# Patient Record
Sex: Male | Born: 1980
Health system: Southern US, Community
[De-identification: ages and names within clinical notes are randomized; demographics above are authoritative.]

## PROBLEM LIST (undated history)

## (undated) DIAGNOSIS — Z8669 Personal history of other diseases of the nervous system and sense organs: Secondary | ICD-10-CM

## (undated) HISTORY — DX: Personal history of other diseases of the nervous system and sense organs: Z86.69

---

## 2009-08-17 ENCOUNTER — Emergency Department (HOSPITAL_BASED_OUTPATIENT_CLINIC_OR_DEPARTMENT_OTHER): Admission: EM | Admit: 2009-08-17 | Discharge: 2009-08-17 | Payer: Self-pay | Admitting: Emergency Medicine

## 2010-08-16 ENCOUNTER — Emergency Department (HOSPITAL_COMMUNITY)
Admission: EM | Admit: 2010-08-16 | Discharge: 2010-08-16 | Disposition: A | Discharge status: Home / Self Care | Payer: Self-pay | Attending: Emergency Medicine | Admitting: Emergency Medicine

## 2010-08-16 DIAGNOSIS — R109 Unspecified abdominal pain: Secondary | ICD-10-CM | POA: Insufficient documentation

## 2010-08-16 DIAGNOSIS — K625 Hemorrhage of anus and rectum: Secondary | ICD-10-CM | POA: Insufficient documentation

## 2010-08-16 DIAGNOSIS — R197 Diarrhea, unspecified: Secondary | ICD-10-CM | POA: Insufficient documentation

## 2010-08-16 DIAGNOSIS — R110 Nausea: Secondary | ICD-10-CM | POA: Insufficient documentation

## 2010-08-16 LAB — DIFFERENTIAL
Basophils Absolute: 0 10*3/uL (ref 0.0–0.1)
Basophils Relative: 0 % (ref 0–1)
Eosinophils Absolute: 0 10*3/uL (ref 0.0–0.7)
Eosinophils Relative: 0 % (ref 0–5)
Lymphocytes Relative: 6 % — ABNORMAL LOW (ref 12–46)
Lymphs Abs: 0.7 10*3/uL (ref 0.7–4.0)
Monocytes Absolute: 0.9 10*3/uL (ref 0.1–1.0)
Monocytes Relative: 8 % (ref 3–12)
Neutro Abs: 9.9 10*3/uL — ABNORMAL HIGH (ref 1.7–7.7)
Neutrophils Relative %: 86 % — ABNORMAL HIGH (ref 43–77)

## 2010-08-16 LAB — CBC
HCT: 43.9 % (ref 39.0–52.0)
Hemoglobin: 15 g/dL (ref 13.0–17.0)
MCH: 28.6 pg (ref 26.0–34.0)
MCHC: 34.2 g/dL (ref 30.0–36.0)
MCV: 83.8 fL (ref 78.0–100.0)
Platelets: 228 10*3/uL (ref 150–400)
RBC: 5.24 MIL/uL (ref 4.22–5.81)
RDW: 12.3 % (ref 11.5–15.5)
WBC: 11.6 10*3/uL — ABNORMAL HIGH (ref 4.0–10.5)

## 2010-08-16 LAB — POCT I-STAT, CHEM 8
BUN: 6 mg/dL (ref 6–23)
Calcium, Ion: 1.09 mmol/L — ABNORMAL LOW (ref 1.12–1.32)
Chloride: 99 mEq/L (ref 96–112)
Creatinine, Ser: 1.5 mg/dL (ref 0.4–1.5)
Glucose, Bld: 112 mg/dL — ABNORMAL HIGH (ref 70–99)
HCT: 49 % (ref 39.0–52.0)
Hemoglobin: 16.7 g/dL (ref 13.0–17.0)
Potassium: 3.7 mEq/L (ref 3.5–5.1)
Sodium: 135 mEq/L (ref 135–145)
TCO2: 24 mmol/L (ref 0–100)

## 2010-08-16 LAB — OCCULT BLOOD, POC DEVICE: Fecal Occult Bld: NEGATIVE

## 2012-08-06 ENCOUNTER — Ambulatory Visit: Payer: Self-pay | Admitting: Family Medicine

## 2012-09-02 ENCOUNTER — Ambulatory Visit (INDEPENDENT_AMBULATORY_CARE_PROVIDER_SITE_OTHER): Payer: BC Managed Care – PPO | Admitting: Family Medicine

## 2012-09-02 ENCOUNTER — Encounter: Payer: Self-pay | Admitting: Family Medicine

## 2012-09-02 VITALS — BP 125/80 | HR 64 | Temp 98.7°F | Ht 72.75 in | Wt 251.2 lb

## 2012-09-02 DIAGNOSIS — Z23 Encounter for immunization: Secondary | ICD-10-CM

## 2012-09-02 DIAGNOSIS — Z0000 Encounter for general adult medical examination without abnormal findings: Secondary | ICD-10-CM

## 2012-09-02 NOTE — Progress Notes (Signed)
Subjective:    Patient ID: Andrew Ball, male    DOB: Sep 25, 1980, 32 y.o.   MRN: [id number]  HPI Here to get est for primary care  Does not have a regular doctor -his girlfriend wanted him to get est    Has hx of seizure in past  When young child - petit mal only Has not had one in over 20 years -and never found a cause   Very strong fam hx of HTN Was worried about that  He has checked bp in past and it has been high   Exercise- works with a Psychologist, educational - checked bp - and it was a bit high- and a week better it was better  Changed diet and working out a bit more  He quit fried foods and less beef than he used to  Eating more vegetables  Some protein shakes for meals at times   He does have to take clients to dinner - and tries to eat healthy when he does ([employer])  Obese with bmi of 33 - but very large frame   Not a heavy drinker - occ social drink on the weekends Not a smoker   Gets 8 hours of sleep a night  Monogamous partner  No children   Patient Active Problem List   Diagnosis Date Noted  . Routine general medical examination at a health care facility 09/02/2012   Past Medical History  Diagnosis Date  . History of petit-mal seizures     as a child no seizures in over 20 years   No past surgical history on file. History  Substance Use Topics  . Smoking status: Never Smoker   . Smokeless tobacco: Not on file  . Alcohol Use: Yes     Comment: occ   Family History  Problem Relation Age of Onset  . Alcohol abuse Other     Uncle  . Arthritis Maternal Grandmother   . Arthritis Paternal Grandmother   . Heart disease Paternal Grandfather     MI, open heart surgery   . Heart attack Paternal Grandfather   . High blood pressure Father   . High blood pressure Other     all grandparents, and uncles and other relatives   No Known Allergies No current outpatient prescriptions on file prior to visit.   No current facility-administered medications  on file prior to visit.    Review of Systems Review of Systems  Constitutional: Negative for fever, appetite change, fatigue and unexpected weight change.  Eyes: Negative for pain and visual disturbance.  Respiratory: Negative for cough and shortness of breath.  neg for PND or orthopnea or pedal edema  Cardiovascular: Negative for cp or palpitations    Gastrointestinal: Negative for nausea, diarrhea and constipation.  Genitourinary: Negative for urgency and frequency.  Skin: Negative for pallor or rash   Neurological: Negative for weakness, light-headedness, numbness and headaches.  Hematological: Negative for adenopathy. Does not bruise/bleed easily.  Psychiatric/Behavioral: Negative for dysphoric mood. The patient is not nervous/anxious.         Objective:   Physical Exam  Constitutional: He appears well-developed and well-nourished. No distress.  overwt and well appearing   HENT:  Head: Normocephalic and atraumatic.  Right Ear: External ear normal.  Left Ear: External ear normal.  Nose: Nose normal.  Mouth/Throat: Oropharynx is clear and moist.  Eyes: Conjunctivae and EOM are normal. Pupils are equal, round, and reactive to light. Right eye exhibits no discharge. Left eye exhibits no  discharge. No scleral icterus.  Neck: Normal range of motion. Neck supple. No JVD present. Carotid bruit is not present. No thyromegaly present.  Cardiovascular: Normal rate, regular rhythm, normal heart sounds and intact distal pulses.  Exam reveals no gallop.   Pulmonary/Chest: Effort normal and breath sounds normal. No respiratory distress. He has no wheezes.  Abdominal: Soft. Bowel sounds are normal. He exhibits no distension, no abdominal bruit and no mass. There is no tenderness.  Musculoskeletal: He exhibits no edema and no tenderness.  No acute joint changes   Lymphadenopathy:    He has no cervical adenopathy.  Neurological: He is alert. He has normal reflexes. No cranial nerve deficit. He  exhibits normal muscle tone. Coordination normal.  Skin: Skin is warm and dry. No rash noted. No erythema. No pallor.  Psychiatric: He has a normal mood and affect.  Cheerful and talkative           Assessment & Plan:

## 2012-09-02 NOTE — Assessment & Plan Note (Signed)
Reviewed health habits including diet and exercise and skin cancer prevention Also reviewed health mt list, fam hx and immunizations   Commended on good effort towards wt loss/ diet /exercise lately - disc healthy wt and bp to achieve Strong fam of HTN in family- will watch out for that (BP: 125/80 mmHg  ) Wellness labs today  Handout given on DASH diet and also HTN to read today

## 2012-09-02 NOTE — Patient Instructions (Addendum)
Keep taking good care of yourself  Continue exercise and also good diet  Blood pressure was great on re check today 125/80- so this is reassuring You have a very strong risk for high blood pressure in the future  Lab today Tetanus shot today

## 2012-09-03 LAB — CBC WITH DIFFERENTIAL/PLATELET
Basophils Absolute: 0 10*3/uL (ref 0.0–0.1)
Basophils Relative: 0.4 % (ref 0.0–3.0)
Eosinophils Absolute: 0.1 10*3/uL (ref 0.0–0.7)
Eosinophils Relative: 1.9 % (ref 0.0–5.0)
HCT: 43.6 % (ref 39.0–52.0)
Hemoglobin: 14.2 g/dL (ref 13.0–17.0)
Lymphocytes Relative: 37.8 % (ref 12.0–46.0)
Lymphs Abs: 2.1 10*3/uL (ref 0.7–4.0)
MCHC: 32.6 g/dL (ref 30.0–36.0)
MCV: 88.7 fl (ref 78.0–100.0)
Monocytes Absolute: 0.5 10*3/uL (ref 0.1–1.0)
Monocytes Relative: 8.2 % (ref 3.0–12.0)
Neutro Abs: 2.9 10*3/uL (ref 1.4–7.7)
Neutrophils Relative %: 51.7 % (ref 43.0–77.0)
Platelets: 239 10*3/uL (ref 150.0–400.0)
RBC: 4.92 Mil/uL (ref 4.22–5.81)
RDW: 13.3 % (ref 11.5–14.6)
WBC: 5.5 10*3/uL (ref 4.5–10.5)

## 2012-09-03 LAB — COMPREHENSIVE METABOLIC PANEL
ALT: 19 U/L (ref 0–53)
AST: 18 U/L (ref 0–37)
Albumin: 4.3 g/dL (ref 3.5–5.2)
Alkaline Phosphatase: 58 U/L (ref 39–117)
BUN: 16 mg/dL (ref 6–23)
CO2: 25 mEq/L (ref 19–32)
Calcium: 9.5 mg/dL (ref 8.4–10.5)
Chloride: 106 mEq/L (ref 96–112)
Creatinine, Ser: 1.3 mg/dL (ref 0.4–1.5)
GFR: 80.61 mL/min (ref >60.00)
Glucose, Bld: 84 mg/dL (ref 70–99)
Potassium: 4.2 mEq/L (ref 3.5–5.1)
Sodium: 139 mEq/L (ref 135–145)
Total Bilirubin: 0.4 mg/dL (ref 0.3–1.2)
Total Protein: 7.8 g/dL (ref 6.0–8.3)

## 2012-09-03 LAB — LIPID PANEL
Cholesterol: 199 mg/dL (ref 0–200)
HDL: 46.6 mg/dL (ref >39.00)
LDL Cholesterol: 119 mg/dL — ABNORMAL HIGH (ref 0–99)
Total CHOL/HDL Ratio: 4
Triglycerides: 169 mg/dL — ABNORMAL HIGH (ref 0.0–149.0)
VLDL: 33.8 mg/dL (ref 0.0–40.0)

## 2012-09-03 LAB — TSH: TSH: 0.82 u[IU]/mL (ref 0.35–5.50)

## 2012-09-04 ENCOUNTER — Encounter: Payer: Self-pay | Admitting: *Deleted

## 2012-12-20 ENCOUNTER — Ambulatory Visit (INDEPENDENT_AMBULATORY_CARE_PROVIDER_SITE_OTHER): Payer: BC Managed Care – PPO | Admitting: Family Medicine

## 2012-12-20 ENCOUNTER — Encounter: Payer: Self-pay | Admitting: Family Medicine

## 2012-12-20 VITALS — BP 126/82 | HR 71 | Temp 98.8°F | Ht 72.75 in | Wt 252.0 lb

## 2012-12-20 DIAGNOSIS — Z202 Contact with and (suspected) exposure to infections with a predominantly sexual mode of transmission: Secondary | ICD-10-CM | POA: Insufficient documentation

## 2012-12-20 DIAGNOSIS — Z2089 Contact with and (suspected) exposure to other communicable diseases: Secondary | ICD-10-CM

## 2012-12-20 DIAGNOSIS — Z23 Encounter for immunization: Secondary | ICD-10-CM

## 2012-12-20 MED ORDER — CEFIXIME 400 MG PO CAPS: ORAL_CAPSULE | ORAL | Status: DC

## 2012-12-20 MED ORDER — AZITHROMYCIN 500 MG PO TABS: ORAL_TABLET | ORAL | Status: DC

## 2012-12-20 NOTE — Progress Notes (Signed)
  Subjective:    Patient ID: Andrew Ball, male    DOB: 17-Jan-1981, 32 y.o.   MRN: [id number]  HPI Here after he found out that he was exposed to gonorrhea   Last checked for STDs was in April   He does have a spot to check out  Not painful Is on his penis  Used some tea tree oil   Has had gonorrhea in the past   Very good about using condoms   He has a new partner -getting checked out as well   He has a little discharge No burning to urinate   Patient Active Problem List   Diagnosis Date Noted  . Exposure to STD 12/20/2012  . Routine general medical examination at a health care facility 09/02/2012   Past Medical History  Diagnosis Date  . History of petit-mal seizures     as a child no seizures in over 20 years   No past surgical history on file. History  Substance Use Topics  . Smoking status: Never Smoker   . Smokeless tobacco: Not on file  . Alcohol Use: Yes     Comment: occ   Family History  Problem Relation Age of Onset  . Alcohol abuse Other     Uncle  . Arthritis Maternal Grandmother   . Arthritis Paternal Grandmother   . Heart disease Paternal Grandfather     MI, open heart surgery   . Heart attack Paternal Grandfather   . High blood pressure Father   . High blood pressure Other     all grandparents, and uncles and other relatives   No Known Allergies No current outpatient prescriptions on file prior to visit.   No current facility-administered medications on file prior to visit.    Review of Systems Review of Systems  Constitutional: Negative for fever, appetite change, fatigue and unexpected weight change.  Eyes: Negative for pain and visual disturbance.  Respiratory: Negative for cough and shortness of breath.   Cardiovascular: Negative for cp or palpitations    Gastrointestinal: Negative for nausea, diarrhea and constipation.  Genitourinary: Negative for urgency and frequency. neg for dysuria  Skin: Negative for pallor or rash    Neurological: Negative for weakness, light-headedness, numbness and headaches.  Hematological: Negative for adenopathy. Does not bruise/bleed easily.  Psychiatric/Behavioral: Negative for dysphoric mood. The patient is not nervous/anxious.         Objective:   Physical Exam  Constitutional: He appears well-developed and well-nourished. No distress.  HENT:  Head: Normocephalic and atraumatic.  Eyes: Conjunctivae and EOM are normal. Pupils are equal, round, and reactive to light.  Neck: Normal range of motion. Neck supple.  Cardiovascular: Normal rate and regular rhythm.   Pulmonary/Chest: Effort normal and breath sounds normal. He has no rales.  Abdominal: Soft. Bowel sounds are normal. He exhibits no distension and no mass. There is no tenderness.  Genitourinary:  Small skin tag- at base of penis anteriorly No discharge or lesion or tenderness  Swab for std test obt  Musculoskeletal: He exhibits no edema.  Lymphadenopathy:    He has no cervical adenopathy.  Neurological: He is alert. He has normal reflexes.  Skin: Skin is warm and dry. No rash noted. No pallor.  Psychiatric: He has a normal mood and affect.          Assessment & Plan:

## 2012-12-20 NOTE — Patient Instructions (Addendum)
Flu shot today  Labs today  We will update you with results  If any problems let me know  Take the antibiotics as directed

## 2012-12-21 LAB — RPR TITER: RPR Titer: 1:16 {titer} — AB

## 2012-12-21 LAB — RPR: RPR Ser Ql: REACTIVE — AB

## 2012-12-21 LAB — GC/CHLAMYDIA PROBE AMP
CT Probe RNA: NEGATIVE
GC Probe RNA: NEGATIVE

## 2012-12-21 LAB — HIV ANTIBODY (ROUTINE TESTING W REFLEX): HIV: NONREACTIVE

## 2012-12-22 NOTE — Assessment & Plan Note (Signed)
With known exp to gonorrhea Empiric tx for gc/ chlamy given Tests for std today incl gc/ chl swab as well as HIV and RPR Disc imp of condom use

## 2012-12-23 LAB — T.PALLIDUM AB, TOTAL: T pallidum Antibodies (TP-PA): >8 S/CO — ABNORMAL HIGH (ref <0.90)

## 2013-01-14 ENCOUNTER — Encounter: Payer: Self-pay | Admitting: Family Medicine

## 2013-01-14 ENCOUNTER — Ambulatory Visit (INDEPENDENT_AMBULATORY_CARE_PROVIDER_SITE_OTHER): Payer: BC Managed Care – PPO | Admitting: Family Medicine

## 2013-01-14 VITALS — BP 106/70 | HR 73 | Temp 98.6°F | Ht 72.75 in | Wt 255.0 lb

## 2013-01-14 DIAGNOSIS — J069 Acute upper respiratory infection, unspecified: Secondary | ICD-10-CM

## 2013-01-14 MED ORDER — BENZONATATE 200 MG PO CAPS: 200.0000 mg | ORAL_CAPSULE | Freq: Three times a day (TID) | ORAL | Status: DC | PRN

## 2013-01-14 NOTE — Patient Instructions (Signed)
Drink lots of fluids and try to get extra rest  Try the tessalon pills - swallow one pill whole up to three times per day  Also get mucinex DM over the counter and take as directed to loosen and help cough also  Update if not starting to improve in a week or if worsening

## 2013-01-14 NOTE — Progress Notes (Signed)
  Subjective:    Patient ID: Andrew Ball, male    DOB: 01-05-1981, 32 y.o.   MRN: [id number]  HPI Here with a cough Has had it for 2 weeks Started as a dry hacking cough-now chest sounds rattly- mucous prod in am - yellow  All in chest A bit of nasal congestion at night No ear or throat pain   No fever   Took claritin otc Also cough drops Day quil does not help much   Patient Active Problem List   Diagnosis Date Noted  . Exposure to STD 12/20/2012  . Routine general medical examination at a health care facility 09/02/2012   Past Medical History  Diagnosis Date  . History of petit-mal seizures     as a child no seizures in over 20 years   No past surgical history on file. History  Substance Use Topics  . Smoking status: Never Smoker   . Smokeless tobacco: Not on file  . Alcohol Use: Yes     Comment: occ   Family History  Problem Relation Age of Onset  . Alcohol abuse Other     Uncle  . Arthritis Maternal Grandmother   . Arthritis Paternal Grandmother   . Heart disease Paternal Grandfather     MI, open heart surgery   . Heart attack Paternal Grandfather   . High blood pressure Father   . High blood pressure Other     all grandparents, and uncles and other relatives   No Known Allergies No current outpatient prescriptions on file prior to visit.   No current facility-administered medications on file prior to visit.     Review of Systems Review of Systems  Constitutional: Negative for fever, appetite change, fatigue and unexpected weight change.  Eyes: Negative for pain and visual disturbance.  ENT pos for rhinorrhea without sinus pain or st Respiratory: Negative for wheeze and shortness of breath.   Cardiovascular: Negative for cp or palpitations    Gastrointestinal: Negative for nausea, diarrhea and constipation.  Genitourinary: Negative for urgency and frequency.  Skin: Negative for pallor or rash   Neurological: Negative for weakness,  light-headedness, numbness and headaches.  Hematological: Negative for adenopathy. Does not bruise/bleed easily.  Psychiatric/Behavioral: Negative for dysphoric mood. The patient is not nervous/anxious.         Objective:   Physical Exam  Constitutional: He appears well-developed and well-nourished. No distress.  overwt and well app  HENT:  Head: Normocephalic and atraumatic.  Left Ear: External ear normal.  Mouth/Throat: Oropharynx is clear and moist.  Nares are injected and congested  No sinus tenderness  Eyes: Conjunctivae and EOM are normal. Pupils are equal, round, and reactive to light. No scleral icterus.  Neck: Normal range of motion. Neck supple.  Cardiovascular: Normal rate and regular rhythm.   Pulmonary/Chest: Breath sounds normal. No respiratory distress. He has no wheezes. He has no rales.  Harsh bs Upper airway sounds-mild  Lymphadenopathy:    He has no cervical adenopathy.  Neurological: He is alert.  Skin: Skin is warm and dry. No rash noted.  Psychiatric: He has a normal mood and affect.          Assessment & Plan:

## 2013-01-15 NOTE — Assessment & Plan Note (Signed)
Reassuring exam Persistent cyclic cough Tessalon px / also adv mucinex DM Disc symptomatic care - see instructions on AVS  Update if not starting to improve in a week or if worsening  -esp if fever or inc cough

## 2013-01-21 ENCOUNTER — Encounter: Payer: Self-pay | Admitting: Family Medicine

## 2013-01-23 MED ORDER — AZITHROMYCIN 250 MG PO TABS: ORAL_TABLET | ORAL | Status: DC

## 2013-01-23 NOTE — Addendum Note (Signed)
Addended by: Roxy Manns A on: 01/23/2013 12:27 PM   Modules accepted: Orders

## 2013-01-28 ENCOUNTER — Ambulatory Visit: Payer: BC Managed Care – PPO | Admitting: Family Medicine

## 2013-01-28 DIAGNOSIS — Z0289 Encounter for other administrative examinations: Secondary | ICD-10-CM

## 2013-04-24 ENCOUNTER — Encounter: Payer: Self-pay | Admitting: Family Medicine

## 2013-06-11 ENCOUNTER — Ambulatory Visit: Payer: BC Managed Care – PPO | Admitting: Family Medicine

## 2013-06-18 ENCOUNTER — Encounter: Payer: Self-pay | Admitting: Family Medicine

## 2013-06-18 ENCOUNTER — Ambulatory Visit (INDEPENDENT_AMBULATORY_CARE_PROVIDER_SITE_OTHER): Payer: BC Managed Care – PPO | Admitting: Family Medicine

## 2013-06-18 VITALS — BP 120/80 | HR 81 | Temp 98.5°F | Ht 72.75 in | Wt 263.0 lb

## 2013-06-18 DIAGNOSIS — F988 Other specified behavioral and emotional disorders with onset usually occurring in childhood and adolescence: Secondary | ICD-10-CM | POA: Insufficient documentation

## 2013-06-18 DIAGNOSIS — Z01818 Encounter for other preprocedural examination: Secondary | ICD-10-CM

## 2013-06-18 DIAGNOSIS — Z113 Encounter for screening for infections with a predominantly sexual mode of transmission: Secondary | ICD-10-CM

## 2013-06-18 MED ORDER — AMPHETAMINE-DEXTROAMPHET ER 20 MG PO CP24: 20.0000 mg | ORAL_CAPSULE | Freq: Every day | ORAL | Status: DC

## 2013-06-18 NOTE — Progress Notes (Signed)
Pre visit review using our clinic review tool, if applicable. No additional management support is needed unless otherwise documented below in the visit note. 

## 2013-06-18 NOTE — Progress Notes (Signed)
Subjective:    Patient ID: Andrew Ball, male    DOB: 10-05-1980, 33 y.o.   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 [employer]  Is interested in medication now   In day to day life - he has a hard time concentrating on reading -he has to read the same passage over and over - so not comprehending  Brain tends to wander and easily distracted  This has caused some problems with work-esp when he is in meetings -he zones out - difficult to bring himself out No hyperactivity   His brother ADHD   No problems with stimulant med / no appetite issues or wt loss and no GI pain or headache  Denies problems emotionally right now    Wants STD screening today  Known hx of syphillis in the past -was treated fully  and has antibodies  Has had condoms break in the past   Patient Active Problem List   Diagnosis Date Noted  . Viral URI with cough 01/14/2013  . Exposure to STD 12/20/2012  . Routine general medical examination at a health care facility 09/02/2012   Past Medical History  Diagnosis Date  . History of petit-mal seizures     as a child no seizures in over 20 years   History reviewed. No pertinent past surgical history. History  Substance Use Topics  . Smoking status: Never Smoker   . Smokeless tobacco: Not on file  . Alcohol Use: Yes     Comment: occ   Family History  Problem Relation Age of Onset  . Alcohol abuse Other     Uncle  . Arthritis Maternal Grandmother   . Arthritis Paternal Grandmother   . Heart disease Paternal Grandfather     MI, open heart surgery   . Heart attack Paternal Grandfather   . High blood pressure Father     . High blood pressure Other     all grandparents, and uncles and other relatives   No Known Allergies No current outpatient prescriptions on file prior to visit.   No current facility-administered medications on file prior to visit.     Review of Systems Review of Systems  Constitutional: Negative for fever, appetite change, fatigue and unexpected weight change.  Eyes: Negative for pain and visual disturbance.  Respiratory: Negative for cough and shortness of breath.   Cardiovascular: Negative for cp or palpitations    Gastrointestinal: Negative for nausea, diarrhea and constipation.  Genitourinary: Negative for urgency and frequency. neg for genital lesions or discharge  Skin: Negative for pallor or rash   Neurological: Negative for weakness, light-headedness, numbness and headaches.  Hematological: Negative for adenopathy. Does not bruise/bleed easily.  Psychiatric/Behavioral: Negative for dysphoric mood. The patient is not nervous/anxious.         Objective:   Physical Exam  Constitutional: He appears well-developed and well-nourished. No distress.  HENT:  Head: Normocephalic and atraumatic.  Mouth/Throat: Oropharynx is clear and moist.  Eyes: Conjunctivae and EOM are normal. Pupils are equal, round, and reactive to light. No scleral icterus.  Neck:  Normal range of motion. Neck supple. No JVD present. Carotid bruit is not present. No thyromegaly present.  Cardiovascular: Normal rate, regular rhythm, normal heart sounds and intact distal pulses.  Exam reveals no gallop.   Pulmonary/Chest: Effort normal and breath sounds normal. No respiratory distress. He has no wheezes. He exhibits no tenderness.  Musculoskeletal: He exhibits no edema.  Lymphadenopathy:    He has no cervical adenopathy.  Neurological: He is alert. He has normal reflexes. He displays no tremor. No cranial nerve deficit. He exhibits normal muscle tone. Coordination normal.  Skin: Skin is warm and dry. No rash  noted. No erythema. No pallor.  Psychiatric: He has a normal mood and affect.  Pleasant and talkative -was being interrupted by phone which did affect his concentration          Assessment & Plan:

## 2013-06-18 NOTE — Patient Instructions (Signed)
Labs today Start the adderall xr 20 mg daily and let me know how it goes or if you have side effects  Follow up with me in about 3 months

## 2013-06-19 LAB — HIV ANTIBODY (ROUTINE TESTING W REFLEX): HIV: NONREACTIVE

## 2013-06-19 LAB — GC/CHLAMYDIA PROBE AMP, URINE
Chlamydia, Swab/Urine, PCR: NEGATIVE
GC Probe Amp, Urine: NEGATIVE

## 2013-06-20 NOTE — Assessment & Plan Note (Signed)
hiv and gc/ chlam done today  No symptoms Remote hx of treated syphillis in the past

## 2013-06-20 NOTE — Assessment & Plan Note (Signed)
Long disc re: symptoms and w/u and tx in the past  Is getting ready to go back to school Will try adderall xr 20 mg daily - disc poss side eff in detail -will update if any problems Rev method for calling and picking up px by hand monthly  Disc organization skills and need to silence phone in class and when studying  F/u planned

## 2013-06-20 NOTE — Assessment & Plan Note (Signed)
HIV test and gc/ chlam done today No symptoms Remote hx of treated syphillis

## 2013-09-08 ENCOUNTER — Ambulatory Visit: Payer: BC Managed Care – PPO | Admitting: Internal Medicine

## 2013-09-08 DIAGNOSIS — Z0289 Encounter for other administrative examinations: Secondary | ICD-10-CM

## 2013-09-17 ENCOUNTER — Encounter: Payer: Self-pay | Admitting: Family Medicine

## 2013-09-17 ENCOUNTER — Ambulatory Visit (INDEPENDENT_AMBULATORY_CARE_PROVIDER_SITE_OTHER): Payer: BC Managed Care – PPO | Admitting: Family Medicine

## 2013-09-17 VITALS — BP 126/78 | HR 68 | Temp 98.5°F | Ht 72.75 in | Wt 254.8 lb

## 2013-09-17 DIAGNOSIS — F988 Other specified behavioral and emotional disorders with onset usually occurring in childhood and adolescence: Secondary | ICD-10-CM

## 2013-09-17 DIAGNOSIS — Z113 Encounter for screening for infections with a predominantly sexual mode of transmission: Secondary | ICD-10-CM

## 2013-09-17 NOTE — Progress Notes (Signed)
Pre visit review using our clinic review tool, if applicable. No additional management support is needed unless otherwise documented below in the visit note. 

## 2013-09-17 NOTE — Progress Notes (Signed)
Subjective:    Patient ID: Andrew Ball, male    DOB: 04-May-1980, 33 y.o.   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 ADD (attention deficit disorder) 06/18/2013  . Screen for STD (sexually transmitted disease) 06/18/2013  . Exposure to STD 12/20/2012  . Routine general medical examination at a health care facility 09/02/2012   Past Medical History  Diagnosis Date  . History of petit-mal seizures     as a child no seizures in over 20 years   No past surgical history on file. History  Substance Use Topics  . Smoking status: Never Smoker   . Smokeless tobacco: Not on file  . Alcohol Use: Yes     Comment: occ   Family History  Problem Relation Age of Onset  . Alcohol abuse Other     Uncle  . Arthritis Maternal Grandmother   . Arthritis Paternal Grandmother   . Heart disease Paternal Grandfather     MI, open heart surgery   . Heart attack Paternal Grandfather   . High blood pressure Father   . High blood pressure Other     all grandparents, and uncles and other relatives   No Known Allergies Current Outpatient Prescriptions on File Prior to Visit  Medication Sig Dispense Refill  . amphetamine-dextroamphetamine (ADDERALL XR) 20 MG 24 hr capsule Take 1 capsule (20 mg  total) by mouth daily.  30 capsule  0   No current facility-administered medications on file prior to visit.    Review of Systems Review of Systems  Constitutional: Negative for fever, appetite change, fatigue and unexpected weight change.  Eyes: Negative for pain and visual disturbance.  Respiratory: Negative for cough and shortness of breath.   Cardiovascular: Negative for cp or palpitations    Gastrointestinal: Negative for nausea, diarrhea and constipation.  Genitourinary: Negative for urgency and frequency. neg for d/c or dysuria or GU lesions  Skin: Negative for pallor or rash   Neurological: Negative for weakness, light-headedness, numbness and headaches.  Hematological: Negative for adenopathy. Does not bruise/bleed easily.  Psychiatric/Behavioral: Negative for dysphoric mood. The patient is not nervous/anxious.  improved attentiveness        Objective:   Physical Exam  Constitutional: He appears well-developed and well-nourished. No distress.  overwt and well app  HENT:  Head: Normocephalic and atraumatic.  Eyes: Conjunctivae and EOM are normal. Pupils are equal, round, and reactive to light.  Neck: Normal range of motion. Neck supple. No thyromegaly present.  Cardiovascular: Normal rate and regular rhythm.   Pulmonary/Chest: Effort normal and breath sounds normal.  Musculoskeletal: He exhibits no edema and  no tenderness.  Neurological: He is alert. He has normal reflexes. He displays no tremor. No cranial nerve deficit. He exhibits normal muscle tone. Coordination normal.  Skin: Skin is warm and dry. No rash noted. No erythema.  Psychiatric: He has a normal mood and affect.          Assessment & Plan:   Problem List Items Addressed This Visit     Other   ADD (attention deficit disorder) - Primary     Doing well so far with adderall xr when needed Will use more in the fall when back taking classes - will f/u then  No side eff so far Wt loss is intentional        Screen for STD (sexually transmitted disease)     HIV and gc/chlam screen today Disc use of condoms     Relevant Orders      HIV antibody (with reflex) (Completed)      GC/chlamydia probe amp, urine (Completed)

## 2013-09-17 NOTE — Patient Instructions (Signed)
I'm glad the adderall is helping  Also the exercise - keep doing that and also eating a healthy diet  Follow up in the fall  Call when you need a refill

## 2013-09-18 LAB — GC/CHLAMYDIA PROBE AMP, URINE
Chlamydia, Swab/Urine, PCR: NEGATIVE
GC Probe Amp, Urine: NEGATIVE

## 2013-09-18 LAB — HIV ANTIBODY (ROUTINE TESTING W REFLEX): HIV 1&2 Ab, 4th Generation: NONREACTIVE

## 2013-09-18 NOTE — Assessment & Plan Note (Signed)
HIV and gc/chlam screen today Disc use of condoms

## 2013-09-18 NOTE — Assessment & Plan Note (Signed)
Doing well so far with adderall xr when needed Will use more in the fall when back taking classes - will f/u then  No side eff so far Wt loss is intentional

## 2015-04-02 ENCOUNTER — Ambulatory Visit (INDEPENDENT_AMBULATORY_CARE_PROVIDER_SITE_OTHER): Payer: BLUE CROSS/BLUE SHIELD | Admitting: Family Medicine

## 2015-04-02 ENCOUNTER — Encounter: Payer: Self-pay | Admitting: Family Medicine

## 2015-04-02 VITALS — BP 126/62 | HR 78 | Temp 98.4°F | Wt 246.2 lb

## 2015-04-02 DIAGNOSIS — Z23 Encounter for immunization: Secondary | ICD-10-CM

## 2015-04-02 DIAGNOSIS — T50905A Adverse effect of unspecified drugs, medicaments and biological substances, initial encounter: Secondary | ICD-10-CM | POA: Insufficient documentation

## 2015-04-02 DIAGNOSIS — F988 Other specified behavioral and emotional disorders with onset usually occurring in childhood and adolescence: Secondary | ICD-10-CM

## 2015-04-02 DIAGNOSIS — Z113 Encounter for screening for infections with a predominantly sexual mode of transmission: Secondary | ICD-10-CM | POA: Diagnosis not present

## 2015-04-02 DIAGNOSIS — F909 Attention-deficit hyperactivity disorder, unspecified type: Secondary | ICD-10-CM | POA: Diagnosis not present

## 2015-04-02 DIAGNOSIS — T887XXA Unspecified adverse effect of drug or medicament, initial encounter: Secondary | ICD-10-CM

## 2015-04-02 LAB — BASIC METABOLIC PANEL
BUN: 12 mg/dL (ref 6–23)
CO2: 32 mEq/L (ref 19–32)
Calcium: 10 mg/dL (ref 8.4–10.5)
Chloride: 102 mEq/L (ref 96–112)
Creatinine, Ser: 1.28 mg/dL (ref 0.40–1.50)
GFR: 82.9 mL/min (ref >60.00)
Glucose, Bld: 93 mg/dL (ref 70–99)
Potassium: 4.5 mEq/L (ref 3.5–5.1)
Sodium: 139 mEq/L (ref 135–145)

## 2015-04-02 MED ORDER — AMPHETAMINE-DEXTROAMPHET ER 20 MG PO CP24: 20.0000 mg | ORAL_CAPSULE | Freq: Every day | ORAL | Status: DC

## 2015-04-02 MED ORDER — EMTRICITABINE-TENOFOVIR DF 200-300 MG PO TABS: 1.0000 | ORAL_TABLET | Freq: Every day | ORAL | Status: DC

## 2015-04-02 NOTE — Patient Instructions (Signed)
Labs for screening today  Flu shot today  When labs return if kidney function is ok - you can start the prEP treatment    Use condoms

## 2015-04-02 NOTE — Progress Notes (Signed)
Subjective:    Patient ID: Andrew Ball, male    DOB: [birth year], 35 y.o.   MRN: [id number]  HPI Here interested in HIV screening  Gc/ chamydia  Hx of syphilis in the past   Has not had known exp  Has had unprotected sex    Also is interested in the prEP - medication dosing to prevent HIV (Truvada) No hx of kidney problems   Had a cold recently but feeling better now   Has taken adderall in the past for ADD Uses it for big projects - just bought a francise - thinks he will need it for a while - intense work lately  Working long hours   Had his flu shot today   Patient Active Problem List   Diagnosis Date Noted  . ADD (attention deficit disorder) 06/18/2013  . Screen for STD (sexually transmitted disease) 06/18/2013  . Exposure to STD 12/20/2012  . Routine general medical examination at a health care facility 09/02/2012   Past Medical History  Diagnosis Date  . History of petit-mal seizures     as a child no seizures in over 20 years   No past surgical history on file. Social History  Substance Use Topics  . Smoking status: Never Smoker   . Smokeless tobacco: None  . Alcohol Use: Yes     Comment: occ   Family History  Problem Relation Age of Onset  . Alcohol abuse Other     Uncle  . Arthritis Maternal Grandmother   . Arthritis Paternal Grandmother   . Heart disease Paternal Grandfather     MI, open heart surgery   . Heart attack Paternal Grandfather   . High blood pressure Father   . High blood pressure Other     all grandparents, and uncles and other relatives   No Known Allergies Current Outpatient Prescriptions on File Prior to Visit  Medication Sig Dispense Refill  . amphetamine-dextroamphetamine (ADDERALL XR) 20 MG 24 hr capsule Take 1 capsule (20 mg total) by mouth daily. (Patient not taking: Reported on 04/02/2015) 30 capsule 0   No current facility-administered medications on file prior to visit.     Review of Systems Review of Systems    Constitutional: Negative for fever, appetite change, fatigue and unexpected weight change.  Eyes: Negative for pain and visual disturbance.  Respiratory: Negative for cough and shortness of breath.   Cardiovascular: Negative for cp or palpitations    Gastrointestinal: Negative for nausea, diarrhea and constipation.  Genitourinary: Negative for urgency and frequency. neg for penile d/c/lesions or dysuria  Skin: Negative for pallor or rash   Neurological: Negative for weakness, light-headedness, numbness and headaches.  Hematological: Negative for adenopathy. Does not bruise/bleed easily.  Psychiatric/Behavioral: Negative for dysphoric mood. The patient is not nervous/anxious.  pos for trouble concentrating        Objective:   Physical Exam  Constitutional: He appears well-developed and well-nourished. No distress.  overwt and well appearing   HENT:  Head: Normocephalic and atraumatic.  Mouth/Throat: Oropharynx is clear and moist.  Eyes: Conjunctivae and EOM are normal. Pupils are equal, round, and reactive to light.  Neck: Normal range of motion. Neck supple. No JVD present. Carotid bruit is not present. No thyromegaly present.  Cardiovascular: Normal rate, regular rhythm, normal heart sounds and intact distal pulses.  Exam reveals no gallop.   Pulmonary/Chest: Effort normal and breath sounds normal. No respiratory distress. He has no wheezes. He has no rales.  No crackles  Abdominal: Soft. Bowel sounds are normal. He exhibits no distension, no abdominal bruit and no mass. There is no tenderness.  Musculoskeletal: He exhibits no edema.  Lymphadenopathy:    He has no cervical adenopathy.  Neurological: He is alert. He has normal reflexes.  Skin: Skin is warm and dry. No rash noted.  Psychiatric: He has a normal mood and affect.  Pleasant/ talkative and attentive           Assessment & Plan:   Problem List Items Addressed This Visit      Other   ADD (attention deficit  disorder)    Pt takes adderall during times when longer work hours are needed Refilled today - working on a big project       Medication adverse effect    Check renal panel to start Truvada (for HIV prevention) today      Relevant Orders   Basic metabolic panel (Completed)   Screen for STD (sexually transmitted disease)    Screen for HIV and also Gc/Chlamydia Has had syphillis and been treated in the past  No symptoms  Interested in STD prev - adv condom use Also considers himself high risk- disc prEP medicine regimen (Truvada)  Will check renal panel and begin it if normal and if HIV is negative        Relevant Orders   GC/chlamydia probe amp, urine   HIV antibody (with reflex) (Completed)    Other Visit Diagnoses    Need for influenza vaccination    -  Primary    Relevant Orders    Flu Vaccine QUAD 36+ mos PF IM (Fluarix & Fluzone Quad PF) (Completed)

## 2015-04-02 NOTE — Progress Notes (Signed)
Pre visit review using our clinic review tool, if applicable. No additional management support is needed unless otherwise documented below in the visit note. 

## 2015-04-03 LAB — HIV ANTIBODY (ROUTINE TESTING W REFLEX): HIV 1&2 Ab, 4th Generation: NONREACTIVE

## 2015-04-03 NOTE — Assessment & Plan Note (Signed)
Screen for HIV and also Gc/Chlamydia Has had syphillis and been treated in the past  No symptoms  Interested in STD prev - adv condom use Also considers himself high risk- disc prEP medicine regimen (Truvada)  Will check renal panel and begin it if normal and if HIV is negative

## 2015-04-03 NOTE — Assessment & Plan Note (Signed)
Check renal panel to start Truvada (for HIV prevention) today

## 2015-04-03 NOTE — Assessment & Plan Note (Signed)
Pt takes adderall during times when longer work hours are needed Refilled today - working on a big project

## 2015-04-06 LAB — GC/CHLAMYDIA PROBE AMP
CT Probe RNA: NOT DETECTED
GC Probe RNA: NOT DETECTED

## 2015-04-22 ENCOUNTER — Encounter: Payer: Self-pay | Admitting: Family Medicine

## 2015-05-04 ENCOUNTER — Encounter: Payer: Self-pay | Admitting: Family Medicine

## 2015-05-04 ENCOUNTER — Telehealth: Payer: Self-pay | Admitting: Family Medicine

## 2015-05-04 MED ORDER — EMTRICITABINE-TENOFOVIR DF 200-300 MG PO TABS: 1.0000 | ORAL_TABLET | Freq: Every day | ORAL | Status: DC

## 2015-05-04 NOTE — Telephone Encounter (Signed)
Printing truvada for mail order  See his last mychart message-he had a phone #  Thanks

## 2015-05-05 MED ORDER — EMTRICITABINE-TENOFOVIR DF 200-300 MG PO TABS: 1.0000 | ORAL_TABLET | Freq: Every day | ORAL | Status: DC

## 2015-05-05 NOTE — Telephone Encounter (Signed)
Called phone # on [employer] and it was [facility], I sent Rx electronically to mail order

## 2015-05-05 NOTE — Telephone Encounter (Signed)
Called phone # on mychart message and it was [facility] pharmacy, I sent Rx electronically to mail order  

## 2015-06-07 ENCOUNTER — Other Ambulatory Visit: Payer: Self-pay | Admitting: Family Medicine

## 2015-06-07 MED ORDER — AMPHETAMINE-DEXTROAMPHET ER 20 MG PO CP24: 20.0000 mg | ORAL_CAPSULE | Freq: Every day | ORAL | Status: DC

## 2015-06-07 NOTE — Telephone Encounter (Signed)
Pt notified Rx ready for pickup 

## 2015-06-07 NOTE — Telephone Encounter (Signed)
Px printed for pick up in IN box  

## 2015-06-07 NOTE — Telephone Encounter (Signed)
See Mychart message, last refilled on 04/02/15 #30 with 0 refills, last OV was 04/02/15, please advise

## 2015-06-08 ENCOUNTER — Encounter: Payer: Self-pay | Admitting: Family Medicine

## 2015-06-24 ENCOUNTER — Encounter: Payer: Self-pay | Admitting: Family Medicine

## 2015-09-24 ENCOUNTER — Other Ambulatory Visit: Payer: Self-pay | Admitting: Family Medicine

## 2015-09-24 MED ORDER — AMPHETAMINE-DEXTROAMPHET ER 20 MG PO CP24: 20.0000 mg | ORAL_CAPSULE | Freq: Every day | ORAL | Status: DC

## 2015-09-24 NOTE — Telephone Encounter (Signed)
Pt notified Rx ready for pickup 

## 2015-09-24 NOTE — Telephone Encounter (Signed)
Px printed for pick up in IN box  

## 2015-09-24 NOTE — Telephone Encounter (Signed)
See mychart message, last filled on 06/07/15 #30 with 0 refills, last OV was 04/02/15, please advise

## 2016-03-05 ENCOUNTER — Telehealth: Payer: Self-pay | Admitting: Family Medicine

## 2016-03-05 DIAGNOSIS — Z0000 Encounter for general adult medical examination without abnormal findings: Secondary | ICD-10-CM

## 2016-03-05 NOTE — Telephone Encounter (Signed)
-----   Message from Baldomero LamyNatasha C Chavers sent at 02/29/2016  2:02 PM EST ----- Regarding: Cpx labs Tues 12/19, need orders. Thanks! :-) Please order  future cpx labs for pt's upcoming lab appt. Thanks Rodney Boozeasha

## 2016-03-07 ENCOUNTER — Other Ambulatory Visit (INDEPENDENT_AMBULATORY_CARE_PROVIDER_SITE_OTHER): Payer: BLUE CROSS/BLUE SHIELD

## 2016-03-07 DIAGNOSIS — Z202 Contact with and (suspected) exposure to infections with a predominantly sexual mode of transmission: Secondary | ICD-10-CM | POA: Diagnosis not present

## 2016-03-07 DIAGNOSIS — Z23 Encounter for immunization: Secondary | ICD-10-CM

## 2016-03-07 DIAGNOSIS — Z0000 Encounter for general adult medical examination without abnormal findings: Secondary | ICD-10-CM

## 2016-03-07 LAB — COMPREHENSIVE METABOLIC PANEL
ALT: 12 U/L (ref 0–53)
AST: 14 U/L (ref 0–37)
Albumin: 4.3 g/dL (ref 3.5–5.2)
Alkaline Phosphatase: 74 U/L (ref 39–117)
BUN: 17 mg/dL (ref 6–23)
CO2: 29 mEq/L (ref 19–32)
Calcium: 9.3 mg/dL (ref 8.4–10.5)
Chloride: 104 mEq/L (ref 96–112)
Creatinine, Ser: 1.28 mg/dL (ref 0.40–1.50)
GFR: 82.45 mL/min (ref >60.00)
Glucose, Bld: 103 mg/dL — ABNORMAL HIGH (ref 70–99)
Potassium: 4.1 mEq/L (ref 3.5–5.1)
Sodium: 139 mEq/L (ref 135–145)
Total Bilirubin: 0.4 mg/dL (ref 0.2–1.2)
Total Protein: 7.5 g/dL (ref 6.0–8.3)

## 2016-03-07 LAB — LIPID PANEL
Cholesterol: 195 mg/dL (ref 0–200)
HDL: 49.8 mg/dL (ref >39.00)
LDL Cholesterol: 128 mg/dL — ABNORMAL HIGH (ref 0–99)
NonHDL: 145.32
Total CHOL/HDL Ratio: 4
Triglycerides: 88 mg/dL (ref 0.0–149.0)
VLDL: 17.6 mg/dL (ref 0.0–40.0)

## 2016-03-07 LAB — CBC WITH DIFFERENTIAL/PLATELET
Basophils Absolute: 0 10*3/uL (ref 0.0–0.1)
Basophils Relative: 0.6 % (ref 0.0–3.0)
Eosinophils Absolute: 0.1 10*3/uL (ref 0.0–0.7)
Eosinophils Relative: 2.1 % (ref 0.0–5.0)
HCT: 45 % (ref 39.0–52.0)
Hemoglobin: 15 g/dL (ref 13.0–17.0)
Lymphocytes Relative: 41.8 % (ref 12.0–46.0)
Lymphs Abs: 2 10*3/uL (ref 0.7–4.0)
MCHC: 33.5 g/dL (ref 30.0–36.0)
MCV: 88.2 fl (ref 78.0–100.0)
Monocytes Absolute: 0.5 10*3/uL (ref 0.1–1.0)
Monocytes Relative: 9.6 % (ref 3.0–12.0)
Neutro Abs: 2.2 10*3/uL (ref 1.4–7.7)
Neutrophils Relative %: 45.9 % (ref 43.0–77.0)
Platelets: 229 10*3/uL (ref 150.0–400.0)
RBC: 5.09 Mil/uL (ref 4.22–5.81)
RDW: 12.9 % (ref 11.5–15.5)
WBC: 4.9 10*3/uL (ref 4.0–10.5)

## 2016-03-07 LAB — TSH: TSH: 0.67 u[IU]/mL (ref 0.35–4.50)

## 2016-03-08 ENCOUNTER — Encounter: Payer: Self-pay | Admitting: Family Medicine

## 2016-03-08 LAB — HIV ANTIBODY (ROUTINE TESTING W REFLEX): HIV 1&2 Ab, 4th Generation: NONREACTIVE

## 2016-03-09 LAB — RPR: RPR Ser Ql: REACTIVE — AB

## 2016-03-09 LAB — FLUORESCENT TREPONEMAL AB(FTA)-IGG-BLD: Fluorescent Treponemal ABS: REACTIVE — AB

## 2016-03-09 LAB — RPR TITER: RPR Titer: 1:64 {titer} — AB

## 2016-03-14 ENCOUNTER — Ambulatory Visit (INDEPENDENT_AMBULATORY_CARE_PROVIDER_SITE_OTHER): Payer: BLUE CROSS/BLUE SHIELD | Admitting: Family Medicine

## 2016-03-14 ENCOUNTER — Encounter: Payer: Self-pay | Admitting: Family Medicine

## 2016-03-14 VITALS — BP 126/68 | HR 65 | Temp 98.7°F | Ht 73.0 in | Wt 246.5 lb

## 2016-03-14 DIAGNOSIS — Z113 Encounter for screening for infections with a predominantly sexual mode of transmission: Secondary | ICD-10-CM | POA: Diagnosis not present

## 2016-03-14 DIAGNOSIS — Z0000 Encounter for general adult medical examination without abnormal findings: Secondary | ICD-10-CM

## 2016-03-14 DIAGNOSIS — F988 Other specified behavioral and emotional disorders with onset usually occurring in childhood and adolescence: Secondary | ICD-10-CM | POA: Diagnosis not present

## 2016-03-14 NOTE — Patient Instructions (Addendum)
Back off of refined carbohydrates/processed foods and added sugars (sweets)  Weight loss is the best thing you can do to prevent diabetes   For cholesterol    Avoid red meat/ fried foods/ egg yolks/ fatty breakfast meats/ butter, cheese and high fat dairy/ and shellfish   (fast food tends to be loaded)   Get back to exercise   Urine test today for gonorrhea and chlamydia

## 2016-03-14 NOTE — Assessment & Plan Note (Signed)
Reviewed health habits including diet and exercise and skin cancer prevention Reviewed appropriate screening tests for age  Also reviewed health mt list, fam hx and immunization status , as well as social and family history   See HPI Labs reviewed Gc/chlamydia urine screening  imms utd  Enc low glycemic diet/wt loss and exercise- plan made

## 2016-03-14 NOTE — Progress Notes (Signed)
Subjective:    Patient ID: Andrew Ball, male    DOB: 04-Aug-1980, 35 y.o.   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[employer]    Wt Readings from Last 3 Encounters:  03/14/16 246 lb 8 oz (111.8 kg)  04/02/15 246 lb 4 oz (111.7 kg)  09/17/13 254 lb 12 oz (115.6 kg)  is taking care of himself  Has not lost any weight   (per pt he got down into the 230s and then had to stop going to the gym due to his job) Plans to work with a trainer the first of the year (for both exercise and meal plan)  Eating healthy overall  (quit pork and red meat)  bmi is 32.5  As a rule - he does not eat enough  Bad about breakfast   Tetanus shot 6/14  Flu shot 12/17  HIV screen 12/17 neg   Reactive RPR as expected with hx of treated syphillis in the past   Family hx of HTN BP Readings from Last 3 Encounters:  03/14/16 126/68  04/02/15 126/62  09/17/13 126/78   STD screen  Neg HIV screen  post RPR (was tx for syphillis in the past)  Takes Truvada for HIV prophylaxis   Hx of ADD adderall xr - does not take it a lot- only when he really needs it for concentration / projects, etc  Works well for him when he needs it    Wants to do urine test for gc/ chlamydia  No symptoms   Family hx  Uncle died of MI in July - at 34 years old  He was a smoker and a heavy drinker    Results for orders placed or performed in visit on 03/07/16  CBC with Differential/Platelet  Result Value Ref Range   WBC 4.9 4.0 - 10.5 K/uL   RBC 5.09 4.22 - 5.81 Mil/uL   Hemoglobin 15.0 13.0 - 17.0 g/dL   HCT 40.945.0 81.139.0 - 91.452.0 %   MCV 88.2 78.0 - 100.0 fl   MCHC 33.5 30.0 - 36.0 g/dL   RDW 78.212.9 95.611.5 - 21.315.5 %   Platelets 229.0 150.0 - 400.0 K/uL   Neutrophils Relative % 45.9 43.0 - 77.0 %   Lymphocytes Relative 41.8 12.0 - 46.0 %   Monocytes Relative 9.6 3.0 - 12.0 %   Eosinophils Relative 2.1 0.0 -  5.0 %   Basophils Relative 0.6 0.0 - 3.0 %   Neutro Abs 2.2 1.4 - 7.7 K/uL   Lymphs Abs 2.0 0.7 - 4.0 K/uL   Monocytes Absolute 0.5 0.1 - 1.0 K/uL   Eosinophils Absolute 0.1 0.0 - 0.7 K/uL   Basophils Absolute 0.0 0.0 - 0.1 K/uL  Comprehensive metabolic panel  Result Value Ref Range   Sodium 139 135 - 145 mEq/L   Potassium 4.1 3.5 - 5.1 mEq/L   Chloride 104 96 - 112 mEq/L   CO2 29 19 - 32 mEq/L   Glucose, Bld 103 (H) 70 - 99 mg/dL   BUN 17 6 - 23 mg/dL   Creatinine, Ser 0.861.28 0.40 - 1.50 mg/dL   Total Bilirubin 0.4 0.2 - 1.2 mg/dL   Alkaline Phosphatase 74 39 - 117 U/L   AST 14 0 - 37 U/L   ALT 12 0 - 53 U/L   Total Protein 7.5 6.0 - 8.3 g/dL   Albumin 4.3  3.5 - 5.2 g/dL   Calcium 9.3 8.4 - 40.9 mg/dL   GFR 81.19 >14.78 mL/min  Lipid panel  Result Value Ref Range   Cholesterol 195 0 - 200 mg/dL   Triglycerides 29.5 0.0 - 149.0 mg/dL   HDL 62.13 >08.65 mg/dL   VLDL 78.4 0.0 - 69.6 mg/dL   LDL Cholesterol 295 (H) 0 - 99 mg/dL   Total CHOL/HDL Ratio 4    NonHDL 145.32   TSH  Result Value Ref Range   TSH 0.67 0.35 - 4.50 uIU/mL  HIV antibody  Result Value Ref Range   HIV 1&2 Ab, 4th Generation NONREACTIVE NONREACTIVE  RPR  Result Value Ref Range   RPR Ser Ql REACTIVE (A) NON REAC  Fluorescent treponemal ab(fta)-IgG-bld  Result Value Ref Range   Fluorescent Treponemal ABS REACTIVE (A)   Rpr titer  Result Value Ref Range   RPR Titer 1:64 (A)      Patient Active Problem List   Diagnosis Date Noted  . Medication adverse effect 04/02/2015  . ADD (attention deficit disorder) 06/18/2013  . Screen for STD (sexually transmitted disease) 06/18/2013  . Exposure to STD 12/20/2012  . Routine general medical examination at a health care facility 09/02/2012   Past Medical History:  Diagnosis Date  . History of petit-mal seizures    as a child no seizures in over 20 years   No past surgical history on file. Social History  Substance Use Topics  . Smoking status: Never  Smoker  . Smokeless tobacco: Never Used  . Alcohol use Yes     Comment: occ   Family History  Problem Relation Age of Onset  . Alcohol abuse Other     Uncle  . Arthritis Maternal Grandmother   . Arthritis Paternal Grandmother   . Heart disease Paternal Grandfather     MI, open heart surgery   . Heart attack Paternal Grandfather   . High blood pressure Father   . High blood pressure Other     all grandparents, and uncles and other relatives   No Known Allergies Current Outpatient Prescriptions on File Prior to Visit  Medication Sig Dispense Refill  . amphetamine-dextroamphetamine (ADDERALL XR) 20 MG 24 hr capsule Take 1 capsule (20 mg total) by mouth daily. 30 capsule 0  . emtricitabine-tenofovir (TRUVADA) 200-300 MG tablet Take 1 tablet by mouth daily. 90 tablet 3   No current facility-administered medications on file prior to visit.     Review of Systems Review of Systems  Constitutional: Negative for fever, appetite change, and unexpected weight change. pos for fatigue at times from schedule  Eyes: Negative for pain and visual disturbance.  Respiratory: Negative for cough and shortness of breath.   Cardiovascular: Negative for cp or palpitations    Gastrointestinal: Negative for nausea, diarrhea and constipation.  Genitourinary: Negative for urgency and frequency.  Skin: Negative for pallor or rash   Neurological: Negative for weakness, light-headedness, numbness and headaches.  Hematological: Negative for adenopathy. Does not bruise/bleed easily.  Psychiatric/Behavioral: Negative for dysphoric mood. The patient is not nervous/anxious.         Objective:   Physical Exam  Constitutional: He appears well-developed and well-nourished. No distress.  overwt and well appearing  HENT:  Head: Normocephalic and atraumatic.  Right Ear: External ear normal.  Left Ear: External ear normal.  Nose: Nose normal.  Mouth/Throat: Oropharynx is clear and moist.  Nares are boggy    Eyes: Conjunctivae and EOM are normal. Pupils are equal,  round, and reactive to light. Right eye exhibits no discharge. Left eye exhibits no discharge. No scleral icterus.  Neck: Normal range of motion. Neck supple. No JVD present. Carotid bruit is not present. No thyromegaly present.  Cardiovascular: Normal rate, regular rhythm, normal heart sounds and intact distal pulses.  Exam reveals no gallop.   Pulmonary/Chest: Effort normal and breath sounds normal. No respiratory distress. He has no wheezes. He exhibits no tenderness.  Abdominal: Soft. Bowel sounds are normal. He exhibits no distension, no abdominal bruit and no mass. There is no tenderness.  Musculoskeletal: He exhibits no edema or tenderness.  Lymphadenopathy:    He has no cervical adenopathy.  Neurological: He is alert. He has normal reflexes. No cranial nerve deficit. He exhibits normal muscle tone. Coordination normal.  Skin: Skin is warm and dry. No rash noted. No erythema. No pallor.  Few brown nevi and skin tags  Psychiatric: He has a normal mood and affect.  Calm and attentive/ pleasant and talkative          Assessment & Plan:   Problem List Items Addressed This Visit      Other   Screen for STD (sexually transmitted disease)    HIV neg (on Truvada and also using condoms) RPR baseline pos (prev tx syphillis)  Gc/chlam today        Relevant Orders   GC/Chlamydia Probe Amp   Routine general medical examination at a health care facility - Primary    Reviewed health habits including diet and exercise and skin cancer prevention Reviewed appropriate screening tests for age  Also reviewed health mt list, fam hx and immunization status , as well as social and family history   See HPI Labs reviewed Gc/chlamydia urine screening  imms utd  Enc low glycemic diet/wt loss and exercise- plan made       ADD (attention deficit disorder)    Doing well with adderall xr used prn for projects/intense work  No c/o

## 2016-03-14 NOTE — Assessment & Plan Note (Signed)
Doing well with adderall xr used prn for projects/intense work  No c/o

## 2016-03-14 NOTE — Assessment & Plan Note (Signed)
HIV neg (on Truvada and also using condoms) RPR baseline pos (prev tx syphillis)  Gc/chlam today

## 2016-03-14 NOTE — Progress Notes (Signed)
Pre visit review using our clinic review tool, if applicable. No additional management support is needed unless otherwise documented below in the visit note. 

## 2016-03-16 NOTE — Addendum Note (Signed)
Addended by: Alvina ChouWALSH, TERRI J on: 03/16/2016 09:17 AM   Modules accepted: Orders

## 2016-04-04 ENCOUNTER — Telehealth: Payer: Self-pay | Admitting: *Deleted

## 2016-04-04 NOTE — Telephone Encounter (Signed)
C[facility] with [facility] left a voicemail requesting a call back regarding this patient. Kandee KeenCory stated that he needs more information on the RPR that patient has and what type.

## 2016-04-04 NOTE — Telephone Encounter (Signed)
I spoke to Beninory and clarified info about pt's RPR test and past hx of treated disease  He thanked me and will call back if needed

## 2016-05-19 ENCOUNTER — Other Ambulatory Visit: Payer: Self-pay | Admitting: *Deleted

## 2016-05-19 MED ORDER — EMTRICITABINE-TENOFOVIR DF 200-300 MG PO TABS: 1.0000 | ORAL_TABLET | Freq: Every day | ORAL | 3 refills | Status: DC

## 2016-05-19 NOTE — Telephone Encounter (Signed)
Please refill for a year, thanks 

## 2016-05-19 NOTE — Telephone Encounter (Signed)
done

## 2016-05-19 NOTE — Telephone Encounter (Signed)
Pt had CPE on 03/14/16, last filled on 05/05/15 #90 tabs with 3 additional refills, please advise

## 2017-01-03 ENCOUNTER — Other Ambulatory Visit: Payer: Self-pay | Admitting: Family Medicine

## 2017-01-03 ENCOUNTER — Encounter: Payer: Self-pay | Admitting: Family Medicine

## 2017-01-03 ENCOUNTER — Ambulatory Visit (INDEPENDENT_AMBULATORY_CARE_PROVIDER_SITE_OTHER): Payer: BLUE CROSS/BLUE SHIELD | Admitting: Family Medicine

## 2017-01-03 VITALS — BP 126/80 | HR 84 | Temp 98.5°F | Ht 73.0 in | Wt 255.0 lb

## 2017-01-03 DIAGNOSIS — Z23 Encounter for immunization: Secondary | ICD-10-CM | POA: Diagnosis not present

## 2017-01-03 DIAGNOSIS — Z113 Encounter for screening for infections with a predominantly sexual mode of transmission: Secondary | ICD-10-CM | POA: Diagnosis not present

## 2017-01-03 DIAGNOSIS — F988 Other specified behavioral and emotional disorders with onset usually occurring in childhood and adolescence: Secondary | ICD-10-CM | POA: Diagnosis not present

## 2017-01-03 DIAGNOSIS — Z0000 Encounter for general adult medical examination without abnormal findings: Secondary | ICD-10-CM | POA: Diagnosis not present

## 2017-01-03 LAB — CBC WITH DIFFERENTIAL/PLATELET
Basophils Absolute: 0 10*3/uL (ref 0.0–0.1)
Basophils Relative: 0.5 % (ref 0.0–3.0)
Eosinophils Absolute: 0.1 10*3/uL (ref 0.0–0.7)
Eosinophils Relative: 2 % (ref 0.0–5.0)
HCT: 45.3 % (ref 39.0–52.0)
Hemoglobin: 14.8 g/dL (ref 13.0–17.0)
Lymphocytes Relative: 49.7 % — ABNORMAL HIGH (ref 12.0–46.0)
Lymphs Abs: 2.2 10*3/uL (ref 0.7–4.0)
MCHC: 32.7 g/dL (ref 30.0–36.0)
MCV: 89.7 fl (ref 78.0–100.0)
Monocytes Absolute: 0.4 10*3/uL (ref 0.1–1.0)
Monocytes Relative: 10 % (ref 3.0–12.0)
Neutro Abs: 1.7 10*3/uL (ref 1.4–7.7)
Neutrophils Relative %: 37.8 % — ABNORMAL LOW (ref 43.0–77.0)
Platelets: 243 10*3/uL (ref 150.0–400.0)
RBC: 5.05 Mil/uL (ref 4.22–5.81)
RDW: 13.3 % (ref 11.5–15.5)
WBC: 4.4 10*3/uL (ref 4.0–10.5)

## 2017-01-03 LAB — TSH: TSH: 1.04 u[IU]/mL (ref 0.35–4.50)

## 2017-01-03 LAB — COMPREHENSIVE METABOLIC PANEL
ALT: 13 U/L (ref 0–53)
AST: 18 U/L (ref 0–37)
Albumin: 4.3 g/dL (ref 3.5–5.2)
Alkaline Phosphatase: 70 U/L (ref 39–117)
BUN: 16 mg/dL (ref 6–23)
CO2: 29 mEq/L (ref 19–32)
Calcium: 9.9 mg/dL (ref 8.4–10.5)
Chloride: 102 mEq/L (ref 96–112)
Creatinine, Ser: 1.28 mg/dL (ref 0.40–1.50)
GFR: 82.05 mL/min (ref >60.00)
Glucose, Bld: 92 mg/dL (ref 70–99)
Potassium: 4.4 mEq/L (ref 3.5–5.1)
Sodium: 138 mEq/L (ref 135–145)
Total Bilirubin: 0.4 mg/dL (ref 0.2–1.2)
Total Protein: 7.9 g/dL (ref 6.0–8.3)

## 2017-01-03 LAB — LIPID PANEL
Cholesterol: 208 mg/dL — ABNORMAL HIGH (ref 0–200)
HDL: 44.8 mg/dL (ref >39.00)
LDL Cholesterol: 138 mg/dL — ABNORMAL HIGH (ref 0–99)
NonHDL: 163.65
Total CHOL/HDL Ratio: 5
Triglycerides: 129 mg/dL (ref 0.0–149.0)
VLDL: 25.8 mg/dL (ref 0.0–40.0)

## 2017-01-03 NOTE — Progress Notes (Signed)
Subjective:    Patient ID: Andrew Ball, male    DOB: 01/02/1981, 36 y.o.   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  Working on delegating some things    Wt Readings from Last 3 Encounters:  01/03/17 255 lb (115.7 kg)  03/14/16 246 lb 8 oz (111.8 kg)  04/02/15 246 lb 4 oz (111.7 kg)  goes to the gym daily - has gained some muscle mass  Eating healthy also (avoids red meat and fried foods)  Not outdoors enough  33.64 kg/m  Flu shot today  Tetanus (Tdap 6/14)  HIV screen done 12/17 Takes Tuvada-no problems  Lab Results  Component Value Date   CREATININE 1.28 03/07/2016   BUN 17 03/07/2016   NA 139 03/07/2016   K 4.1 03/07/2016   CL 104 03/07/2016   CO2 29 03/07/2016   Wants to do STD testing   No s/s of STDs    Hx of pos RPR -treated for syphillis in the paste   fam hx - uncle died of heart related illness in 4840s   ADD-does not take his adderall all the time  Uses it for big projects  Does not need a refill today   Patient Active Problem List   Diagnosis Date Noted  . Medication adverse effect 04/02/2015  . ADD (attention deficit disorder) 06/18/2013  . Screen for STD (sexually transmitted disease) 06/18/2013  . Exposure to STD 12/20/2012  . Routine general medical examination at a health care facility 09/02/2012   Past Medical History:  Diagnosis Date  . History of petit-mal seizures    as a child no seizures in over 20 years   No past surgical history on file. Social History  Substance Use Topics  . Smoking status: Never Smoker  . Smokeless tobacco: Never Used  . Alcohol use Yes     Comment: occasional   Family History  Problem Relation Age of Onset  . Alcohol abuse  Other        Uncle  . Arthritis Maternal Grandmother   . Arthritis Paternal Grandmother   . Heart disease Paternal Grandfather        MI, open heart surgery   . Heart attack Paternal Grandfather   . High blood pressure Father   . High blood pressure Other        all grandparents, and uncles and other relatives   No Known Allergies Current Outpatient Prescriptions on File Prior to Visit  Medication Sig Dispense Refill  . amphetamine-dextroamphetamine (ADDERALL XR) 20 MG 24 hr capsule Take 1 capsule (20 mg total) by mouth daily. 30 capsule 0  . emtricitabine-tenofovir (TRUVADA) 200-300 MG tablet Take 1 tablet by mouth daily. 90 tablet 3   No current facility-administered medications on file prior to visit.      Review of Systems  Constitutional: Positive for fatigue. Negative for activity change, appetite change, fever and unexpected weight change.  HENT: Negative for congestion, rhinorrhea, sore throat and trouble swallowing.   Eyes: Negative for pain, redness, itching and visual disturbance.  Respiratory: Negative for cough, chest tightness, shortness of breath and wheezing.   Cardiovascular: Negative for chest pain  and palpitations.  Gastrointestinal: Negative for abdominal pain, blood in stool, constipation, diarrhea and nausea.  Endocrine: Negative for cold intolerance, heat intolerance, polydipsia and polyuria.  Genitourinary: Negative for difficulty urinating, dysuria, frequency and urgency.  Musculoskeletal: Negative for arthralgias, joint swelling and myalgias.  Skin: Negative for pallor and rash.  Neurological: Negative for dizziness, tremors, weakness, numbness and headaches.  Hematological: Negative for adenopathy. Does not bruise/bleed easily.  Psychiatric/Behavioral: Negative for decreased concentration and dysphoric mood. The patient is not nervous/anxious.        Objective:   Physical Exam  Constitutional: He appears well-developed and well-nourished. No distress.   overwt and well appearing  HENT:  Head: Normocephalic and atraumatic.  Right Ear: External ear normal.  Left Ear: External ear normal.  Nose: Nose normal.  Mouth/Throat: Oropharynx is clear and moist.  Eyes: Pupils are equal, round, and reactive to light. Conjunctivae and EOM are normal. Right eye exhibits no discharge. Left eye exhibits no discharge. No scleral icterus.  Neck: Normal range of motion. Neck supple. No JVD present. Carotid bruit is not present. No thyromegaly present.  Cardiovascular: Normal rate, regular rhythm, normal heart sounds and intact distal pulses.  Exam reveals no gallop.   Pulmonary/Chest: Effort normal and breath sounds normal. No respiratory distress. He has no wheezes. He exhibits no tenderness.  Abdominal: Soft. Bowel sounds are normal. He exhibits no distension, no abdominal bruit and no mass. There is no tenderness.  Musculoskeletal: He exhibits no edema or tenderness.  Lymphadenopathy:    He has no cervical adenopathy.  Neurological: He is alert. He has normal reflexes. No cranial nerve deficit. He exhibits normal muscle tone. Coordination normal.  Skin: Skin is warm and dry. No rash noted. No erythema. No pallor.  Few skin tags  Psychiatric: He has a normal mood and affect.  Pleasant and talkative  Occasionally distracted           Assessment & Plan:   Problem List Items Addressed This Visit      Other   ADD (attention deficit disorder)    adderall xr works well- uses only for large projects  Doing well overall  Does not need refill today      Routine general medical examination at a health care facility - Primary    Reviewed health habits including diet and exercise and skin cancer prevention Reviewed appropriate screening tests for age  Also reviewed health mt list, fam hx and immunization status , as well as social and family history   Wellness labs drawn today  STD screen today  Continues truvada for prev of HIV       Relevant  Orders   CBC with Differential/Platelet (Completed)   Comprehensive metabolic panel (Completed)   Lipid panel (Completed)   TSH (Completed)   Screen for STD (sexually transmitted disease)    Pt uses condoms Also Truvada for HIV prevention  Lab today  Gc/chlamydia  HIV  Pt has hx of ab to syphillis (pos RPR) No std symptoms      Relevant Orders   HIV antibody   GC/Chlamydia Probe Amp    Other Visit Diagnoses    Need for influenza vaccination       Relevant Orders   Flu Vaccine QUAD 36+ mos IM (Completed)

## 2017-01-03 NOTE — Patient Instructions (Signed)
Take care of yourself  Keep exercising  Eat healthy Get outdoors more   Labs today  No change in medicine  Flu shot today

## 2017-01-04 LAB — GC/CHLAMYDIA PROBE AMP
Chlamydia trachomatis, NAA: NEGATIVE
Neisseria gonorrhoeae by PCR: NEGATIVE

## 2017-01-04 NOTE — Assessment & Plan Note (Signed)
adderall xr works well- uses only for large projects  Doing well overall  Does not need refill today

## 2017-01-04 NOTE — Assessment & Plan Note (Signed)
Pt uses condoms Also Truvada for HIV prevention  Lab today  Gc/chlamydia  HIV  Pt has hx of ab to syphillis (pos RPR) No std symptoms

## 2017-01-04 NOTE — Assessment & Plan Note (Signed)
Reviewed health habits including diet and exercise and skin cancer prevention Reviewed appropriate screening tests for age  Also reviewed health mt list, fam hx and immunization status , as well as social and family history   Wellness labs drawn today  STD screen today  Continues truvada for prev of HIV

## 2017-01-05 ENCOUNTER — Telehealth: Payer: Self-pay

## 2017-01-05 NOTE — Telephone Encounter (Signed)
[facility] is resulting HIV an they will fax results asap.

## 2017-01-05 NOTE — Addendum Note (Signed)
Addended by: Gregery NaVALENCIA, Eleana Tocco P on: 01/05/2017 10:23 AM   Modules accepted: Orders

## 2017-01-05 NOTE — Telephone Encounter (Signed)
Thanks- will watch for it

## 2017-01-07 LAB — HIV ANTIBODY (ROUTINE TESTING W REFLEX): HIV Screen 4th Generation wRfx: NONREACTIVE

## 2017-01-08 ENCOUNTER — Encounter: Payer: Self-pay | Admitting: Family Medicine

## 2018-01-22 ENCOUNTER — Encounter: Payer: Self-pay | Admitting: Family Medicine

## 2018-01-22 NOTE — Telephone Encounter (Signed)
Please ask what pharmacy he wants this truvada to go to and send  Thanks

## 2018-01-23 MED ORDER — EMTRICITABINE-TENOFOVIR DF 200-300 MG PO TABS: 1.0000 | ORAL_TABLET | Freq: Every day | ORAL | 3 refills | Status: DC

## 2018-01-23 NOTE — Telephone Encounter (Signed)
Rx sent 

## 2018-02-06 ENCOUNTER — Encounter: Payer: Self-pay | Admitting: Family Medicine

## 2018-02-06 ENCOUNTER — Ambulatory Visit (INDEPENDENT_AMBULATORY_CARE_PROVIDER_SITE_OTHER)
Admission: RE | Admit: 2018-02-06 | Discharge: 2018-02-06 | Disposition: A | Discharge status: Home / Self Care | Payer: BLUE CROSS/BLUE SHIELD | Source: Ambulatory Visit | Attending: Family Medicine | Admitting: Family Medicine

## 2018-02-06 ENCOUNTER — Ambulatory Visit (INDEPENDENT_AMBULATORY_CARE_PROVIDER_SITE_OTHER): Payer: BLUE CROSS/BLUE SHIELD | Admitting: Family Medicine

## 2018-02-06 VITALS — BP 104/70 | HR 63 | Temp 99.0°F | Ht 72.75 in | Wt 260.5 lb

## 2018-02-06 DIAGNOSIS — Z23 Encounter for immunization: Secondary | ICD-10-CM | POA: Diagnosis not present

## 2018-02-06 DIAGNOSIS — M79675 Pain in left toe(s): Secondary | ICD-10-CM | POA: Diagnosis not present

## 2018-02-06 DIAGNOSIS — E7800 Pure hypercholesterolemia, unspecified: Secondary | ICD-10-CM | POA: Diagnosis not present

## 2018-02-06 DIAGNOSIS — E785 Hyperlipidemia, unspecified: Secondary | ICD-10-CM | POA: Insufficient documentation

## 2018-02-06 DIAGNOSIS — Z0000 Encounter for general adult medical examination without abnormal findings: Secondary | ICD-10-CM | POA: Diagnosis not present

## 2018-02-06 DIAGNOSIS — F988 Other specified behavioral and emotional disorders with onset usually occurring in childhood and adolescence: Secondary | ICD-10-CM | POA: Diagnosis not present

## 2018-02-06 DIAGNOSIS — Z113 Encounter for screening for infections with a predominantly sexual mode of transmission: Secondary | ICD-10-CM

## 2018-02-06 LAB — COMPREHENSIVE METABOLIC PANEL
ALT: 16 U/L (ref 0–53)
AST: 16 U/L (ref 0–37)
Albumin: 4.5 g/dL (ref 3.5–5.2)
Alkaline Phosphatase: 71 U/L (ref 39–117)
BUN: 18 mg/dL (ref 6–23)
CO2: 30 mEq/L (ref 19–32)
Calcium: 9.8 mg/dL (ref 8.4–10.5)
Chloride: 102 mEq/L (ref 96–112)
Creatinine, Ser: 1.26 mg/dL (ref 0.40–1.50)
GFR: 83.04 mL/min (ref >60.00)
Glucose, Bld: 94 mg/dL (ref 70–99)
Potassium: 4.4 mEq/L (ref 3.5–5.1)
Sodium: 137 mEq/L (ref 135–145)
Total Bilirubin: 0.5 mg/dL (ref 0.2–1.2)
Total Protein: 8.1 g/dL (ref 6.0–8.3)

## 2018-02-06 LAB — CBC WITH DIFFERENTIAL/PLATELET
Basophils Absolute: 0 10*3/uL (ref 0.0–0.1)
Basophils Relative: 0.5 % (ref 0.0–3.0)
Eosinophils Absolute: 0.1 10*3/uL (ref 0.0–0.7)
Eosinophils Relative: 2 % (ref 0.0–5.0)
HCT: 44.8 % (ref 39.0–52.0)
Hemoglobin: 14.9 g/dL (ref 13.0–17.0)
Lymphocytes Relative: 50 % — ABNORMAL HIGH (ref 12.0–46.0)
Lymphs Abs: 2.4 10*3/uL (ref 0.7–4.0)
MCHC: 33.4 g/dL (ref 30.0–36.0)
MCV: 87.2 fl (ref 78.0–100.0)
Monocytes Absolute: 0.4 10*3/uL (ref 0.1–1.0)
Monocytes Relative: 9.3 % (ref 3.0–12.0)
Neutro Abs: 1.8 10*3/uL (ref 1.4–7.7)
Neutrophils Relative %: 38.2 % — ABNORMAL LOW (ref 43.0–77.0)
Platelets: 256 10*3/uL (ref 150.0–400.0)
RBC: 5.13 Mil/uL (ref 4.22–5.81)
RDW: 13.3 % (ref 11.5–15.5)
WBC: 4.7 10*3/uL (ref 4.0–10.5)

## 2018-02-06 LAB — LIPID PANEL
Cholesterol: 214 mg/dL — ABNORMAL HIGH (ref 0–200)
HDL: 46.1 mg/dL
LDL Cholesterol: 147 mg/dL — ABNORMAL HIGH (ref 0–99)
NonHDL: 168.27
Total CHOL/HDL Ratio: 5
Triglycerides: 105 mg/dL (ref 0.0–149.0)
VLDL: 21 mg/dL (ref 0.0–40.0)

## 2018-02-06 LAB — TSH: TSH: 1 u[IU]/mL (ref 0.35–4.50)

## 2018-02-06 NOTE — Assessment & Plan Note (Signed)
Reviewed health habits including diet and exercise and skin cancer prevention Reviewed appropriate screening tests for age  Also reviewed health mt list, fam hx and immunization status , as well as social and family history   See HPI Labs ordered Enc wt loss with healthy diet and exercise  STD screen today  Refilled Truvada for prev of HIV  Flu vaccine today

## 2018-02-06 NOTE — Progress Notes (Signed)
Subjective:    Patient ID: Andrew Ball, male    DOB: [birth year], 37 y.o.   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  3 weeks ago- fell in bathroom (wet/marble floor)  L great toe  Hurts - swollen  Can only wear certain shoes (cloth on top)    Wt Readings from Last 3 Encounters:  02/06/18 260 lb 8 oz (118.2 kg)  01/03/17 255 lb (115.7 kg)  03/14/16 246 lb 8 oz (111.8 kg)  takes care of himself  [name] a lot for work - wants to work harder on lifestyle change on the road  Trying to eat better  34.61 kg/m   Flu vaccine - needs it   Tetanus shot 6/14  HIV screen 10/18 neg  Takes truvada for prevention    Last cholesterol Lab Results  Component Value Date   CHOL 208 (H) 01/03/2017   HDL 44.80 01/03/2017   LDLCALC 138 (H) 01/03/2017   TRIG 129.0 01/03/2017   CHOLHDL 5 01/03/2017  wants to join a gym in [place] Also air fryer - less fat  Avoids red meat    ADD treatment adderall  - only takes when he really needs it  Does not need a refill yet   STD screen  No symptoms  Baseline pos RPR  No new family hx  HTN in family   BP Readings from Last 3 Encounters:  02/06/18 104/70  01/03/17 126/80  03/14/16 126/68   Pulse Readings from Last 3 Encounters:  02/06/18 63  01/03/17 84  03/14/16 65   Patient Active Problem List   Diagnosis Date Noted  . Hyperlipidemia 02/06/2018  . Great toe pain, left 02/06/2018  . Medication adverse effect 04/02/2015  . ADD (attention deficit disorder) 06/18/2013  . Screen for STD (sexually transmitted disease) 06/18/2013  . Exposure to STD 12/20/2012  . Routine general medical examination at a health care facility 09/02/2012   Past Medical History:  Diagnosis Date  . History of petit-mal seizures    as a child no seizures in over 20 years   History reviewed. No pertinent  surgical history. Social History   Tobacco Use  . Smoking status: Never Smoker  . Smokeless tobacco: Never Used  Substance Use Topics  . Alcohol use: Yes    Comment: occasional  . Drug use: No   Family History  Problem Relation Age of Onset  . Alcohol abuse Other        Uncle  . Arthritis Maternal Grandmother   . Arthritis Paternal Grandmother   . Heart disease Paternal Grandfather        MI, open heart surgery   . Heart attack Paternal Grandfather   . High blood pressure Father   . High blood pressure Other        all grandparents, and uncles and other relatives   No Known Allergies Current Outpatient Medications on File Prior to Visit  Medication Sig Dispense Refill  . amphetamine-dextroamphetamine (ADDERALL XR) 20 MG 24 hr capsule Take 1 capsule (20 mg total) by mouth daily. 30 capsule 0  . emtricitabine-tenofovir (TRUVADA) 200-300 MG tablet Take 1 tablet by mouth daily. 90 tablet 3   No current facility-administered medications on file prior to visit.     Review of Systems  Constitutional: Negative for activity change, appetite change,  fatigue, fever and unexpected weight change.  HENT: Negative for congestion, rhinorrhea, sore throat and trouble swallowing.   Eyes: Negative for pain, redness, itching and visual disturbance.  Respiratory: Negative for cough, chest tightness, shortness of breath and wheezing.   Cardiovascular: Negative for chest pain and palpitations.  Gastrointestinal: Negative for abdominal pain, blood in stool, constipation, diarrhea and nausea.  Endocrine: Negative for cold intolerance, heat intolerance, polydipsia and polyuria.  Genitourinary: Negative for difficulty urinating, dysuria, frequency and urgency.  Musculoskeletal: Negative for arthralgias, joint swelling and myalgias.       Left great toe pain and swelling   Skin: Negative for pallor and rash.  Neurological: Negative for dizziness, tremors, weakness, numbness and headaches.    Hematological: Negative for adenopathy. Does not bruise/bleed easily.  Psychiatric/Behavioral: Negative for decreased concentration and dysphoric mood. The patient is not nervous/anxious.        Objective:   Physical Exam  Constitutional: He appears well-developed and well-nourished. No distress.  obese and well appearing   HENT:  Head: Normocephalic and atraumatic.  Right Ear: External ear normal.  Left Ear: External ear normal.  Nose: Nose normal.  Mouth/Throat: Oropharynx is clear and moist.  Eyes: Pupils are equal, round, and reactive to light. Conjunctivae and EOM are normal. Right eye exhibits no discharge. Left eye exhibits no discharge. No scleral icterus.  Neck: Normal range of motion. Neck supple. No JVD present. Carotid bruit is not present. No thyromegaly present.  Cardiovascular: Normal rate, regular rhythm, normal heart sounds and intact distal pulses. Exam reveals no gallop.  Pulmonary/Chest: Effort normal and breath sounds normal. No respiratory distress. He has no wheezes. He has no rales. He exhibits no tenderness.  Abdominal: Soft. Bowel sounds are normal. He exhibits no distension, no abdominal bruit and no mass. There is no tenderness.  Musculoskeletal: He exhibits no tenderness.  L great toe-mildly swollen Pain at endpoints of flexion and extension  No crepitus  Mild bony tenderness of MTP joint  No redness or warmth Nl sens and perf  Lymphadenopathy:    He has no cervical adenopathy.  Neurological: He is alert. He has normal reflexes. He displays normal reflexes. No cranial nerve deficit. He exhibits normal muscle tone. Coordination normal.  Skin: Skin is warm and dry. No rash noted. No erythema. No pallor.  Psychiatric: He has a normal mood and affect.  Pleasant           Assessment & Plan:   Problem List Items Addressed This Visit      Other   Screen for STD (sexually transmitted disease)    HIV/ gc/chlamydia today  Continues Truvada for prev  of HIV  Enc safe sexual practices      Relevant Orders   HIV Antibody (routine testing w rflx)   C. trachomatis/N. gonorrhoeae RNA   Routine general medical examination at a health care facility - Primary (Chronic)    Reviewed health habits including diet and exercise and skin cancer prevention Reviewed appropriate screening tests for age  Also reviewed health mt list, fam hx and immunization status , as well as social and family history   See HPI Labs ordered Enc wt loss with healthy diet and exercise  STD screen today  Refilled Truvada for prev of HIV  Flu vaccine today      Relevant Orders   CBC with Differential/Platelet   Comprehensive metabolic panel   Lipid panel   TSH   Hyperlipidemia    Disc goals for lipids and  reasons to control them Rev last labs with pt Rev low sat fat diet in detail -lab today  Disc ways to reduce dietary fat      Great toe pain, left    Injured/jammed slipping in bathroom 3 wk ago Some swelling  Still tender-not able to wear regular shoes Xray today      Relevant Orders   DG Toe Great Left   ADD (attention deficit disorder)    Doing well with adderall as needed         Other Visit Diagnoses    Need for influenza vaccination       Relevant Orders   Flu Vaccine QUAD 6+ mos PF IM (Fluarix Quad PF) (Completed)

## 2018-02-06 NOTE — Assessment & Plan Note (Signed)
Disc goals for lipids and reasons to control them Rev last labs with pt Rev low sat fat diet in detail -lab today  Disc ways to reduce dietary fat

## 2018-02-06 NOTE — Patient Instructions (Addendum)
Try to schedule some exercise every day   For weight loss Try to get most of your carbohydrates from produce (with the exception of white potatoes)  Eat less bread/pasta/rice/snack foods/cereals/sweets and other items from the middle of the grocery store (processed carbs)   For cholesterol Avoid red meat/ fried foods/ egg yolks/ fatty breakfast meats/ butter, cheese and high fat dairy/ and shellfish    Flu shot today  Labs today  Also STD screening  We will xray your toe as well and call with result   Take care of yourself

## 2018-02-06 NOTE — Assessment & Plan Note (Signed)
HIV/ gc/chlamydia today  Continues Truvada for prev of HIV  Enc safe sexual practices

## 2018-02-06 NOTE — Assessment & Plan Note (Signed)
Doing well with adderall as needed

## 2018-02-06 NOTE — Assessment & Plan Note (Signed)
Injured/jammed slipping in bathroom 3 wk ago Some swelling  Still tender-not able to wear regular shoes Xray today

## 2018-02-07 LAB — C. TRACHOMATIS/N. GONORRHOEAE RNA
C. trachomatis RNA, TMA: NOT DETECTED
N. gonorrhoeae RNA, TMA: NOT DETECTED

## 2018-02-07 LAB — HIV ANTIBODY (ROUTINE TESTING W REFLEX): HIV 1&2 Ab, 4th Generation: NONREACTIVE

## 2018-03-27 ENCOUNTER — Emergency Department (HOSPITAL_BASED_OUTPATIENT_CLINIC_OR_DEPARTMENT_OTHER): Payer: BLUE CROSS/BLUE SHIELD

## 2018-03-27 ENCOUNTER — Other Ambulatory Visit: Payer: Self-pay

## 2018-03-27 ENCOUNTER — Encounter (HOSPITAL_BASED_OUTPATIENT_CLINIC_OR_DEPARTMENT_OTHER): Payer: Self-pay | Admitting: Emergency Medicine

## 2018-03-27 ENCOUNTER — Emergency Department (HOSPITAL_BASED_OUTPATIENT_CLINIC_OR_DEPARTMENT_OTHER)
Admission: EM | Admit: 2018-03-27 | Discharge: 2018-03-27 | Disposition: A | Discharge status: Home / Self Care | Payer: BLUE CROSS/BLUE SHIELD | Attending: Emergency Medicine | Admitting: Emergency Medicine

## 2018-03-27 DIAGNOSIS — Z79899 Other long term (current) drug therapy: Secondary | ICD-10-CM | POA: Diagnosis not present

## 2018-03-27 DIAGNOSIS — M79671 Pain in right foot: Secondary | ICD-10-CM | POA: Insufficient documentation

## 2018-03-27 MED ORDER — NAPROXEN 500 MG PO TABS: 500.0000 mg | ORAL_TABLET | Freq: Two times a day (BID) | ORAL | 0 refills | Status: AC

## 2018-03-27 MED ORDER — COLCHICINE 0.6 MG PO TABS: 0.6000 mg | ORAL_TABLET | Freq: Every day | ORAL | 0 refills | Status: DC

## 2018-03-27 MED ORDER — KETOROLAC TROMETHAMINE 60 MG/2ML IM SOLN
60.0000 mg | Freq: Once | INTRAMUSCULAR | Status: AC
  Administered 2018-03-27: 60 mg via INTRAMUSCULAR
  Filled 2018-03-27: qty 2

## 2018-03-27 MED ORDER — DEXAMETHASONE 6 MG PO TABS
10.0000 mg | ORAL_TABLET | Freq: Once | ORAL | Status: AC
  Administered 2018-03-27: 10 mg via ORAL
  Filled 2018-03-27: qty 1

## 2018-03-27 MED FILL — COLCHICINE 0.6 MG TABS: 0.6 | 1 days supply | Qty: 3 | Fill #0

## 2018-03-27 MED FILL — NAPROXEN 500 MG TABLET: 500 | 7 days supply | Qty: 14 | Fill #0

## 2018-03-27 NOTE — ED Notes (Signed)
ED Provider at bedside. 

## 2018-03-27 NOTE — ED Notes (Signed)
Patient transported to X-ray 

## 2018-03-27 NOTE — Discharge Instructions (Addendum)
Take Tylenol 1000 mg 4 times a day for 1 week. This is the maximum dose of Tylenol (acetaminophen) you can take from all sources. Please check other over-the-counter medications and prescriptions to ensure you are not taking other medications that contain acetaminophen.   °

## 2018-03-27 NOTE — ED Triage Notes (Signed)
Pain to bottom and top of left foot just proximal to toes this morning. Sts "I think something bit me last night."  No obvious bite site.  Slight swelling and tenderness.

## 2018-03-27 NOTE — ED Provider Notes (Signed)
[facility] EMERGENCY DEPARTMENT Provider Note   CSN: [id number] Arrival date & time: 03/27/18  1006     History   Chief Complaint Chief Complaint  Patient presents with  . Foot Pain    HPI Andrew Ball is a 38 y.o. male.  HPI   38yo male presents with concern for left foot pain. Reports waking this AM with severe pain to the plantar surface of his foot just proximal to his 2-4th toes.  Reports swelling in the area. No fevers, no hx of IVDU.  No hx of gout. No 1st MTP pain.  Reports walking but does not think he has overuse injury.  Wears a variety of shoes but nothing new to cause this.  Pain severe. Worse when legs hanging off of the side of the bed and with ambulation.  Denies other illness   Past Medical History:  Diagnosis Date  . History of petit-mal seizures    as a child no seizures in over 20 years    Patient Active Problem List   Diagnosis Date Noted  . Hyperlipidemia 02/06/2018  . Great toe pain, left 02/06/2018  . Medication adverse effect 04/02/2015  . ADD (attention deficit disorder) 06/18/2013  . Screen for STD (sexually transmitted disease) 06/18/2013  . Exposure to STD 12/20/2012  . Routine general medical examination at a health care facility 09/02/2012    History reviewed. No pertinent surgical history.      Home Medications    Prior to Admission medications   Medication Sig Start Date End Date Taking? Authorizing Provider  amphetamine-dextroamphetamine (ADDERALL XR) 20 MG 24 hr capsule Take 1 capsule (20 mg total) by mouth daily. 09/24/15   Tower, Audrie GallusMarne A, MD  colchicine 0.6 MG tablet Take 1 tablet (0.6 mg total) by mouth daily. Take 1.2mg  (2 tablets) followed by 1 .6mg  tablet one hour later. 03/27/18   Alvira MondaySchlossman, Deondra Wigger, MD  emtricitabine-tenofovir (TRUVADA) 200-300 MG tablet Take 1 tablet by mouth daily. 01/23/18   Tower, Audrie GallusMarne A, MD  naproxen (NAPROSYN) 500 MG tablet Take 1 tablet (500 mg total) by mouth 2 (two) times daily with a  meal. 03/27/18   Alvira MondaySchlossman, Shukri Nistler, MD    Family History Family History  Problem Relation Age of Onset  . Alcohol abuse Other        Uncle  . Arthritis Maternal Grandmother   . Arthritis Paternal Grandmother   . Heart disease Paternal Grandfather        MI, open heart surgery   . Heart attack Paternal Grandfather   . High blood pressure Father   . High blood pressure Other        all grandparents, and uncles and other relatives    Social History Social History   Tobacco Use  . Smoking status: Never Smoker  . Smokeless tobacco: Never Used  Substance Use Topics  . Alcohol use: Yes    Comment: occasional  . Drug use: No     Allergies   Patient has no known allergies.   Review of Systems Review of Systems  Constitutional: Negative for fever.  Eyes: Negative for visual disturbance.  Respiratory: Negative for shortness of breath.   Gastrointestinal: Negative for abdominal pain, nausea and vomiting.  Genitourinary: Negative for difficulty urinating.  Musculoskeletal: Positive for arthralgias.  Skin: Negative for rash.  Neurological: Negative for syncope.     Physical Exam Updated Vital Signs BP (!) 135/94 (BP Location: Right Arm)   Pulse 66   Temp 98.6 F (37  C) (Oral)   Resp 16   Ht 6\' 2"  (1.88 m)   Wt 113.4 kg   SpO2 100%   BMI 32.10 kg/m   Physical Exam Vitals signs and nursing note reviewed.  Constitutional:      General: He is not in acute distress.    Appearance: He is well-developed. He is not diaphoretic.  HENT:     Head: Normocephalic and atraumatic.  Eyes:     Conjunctiva/sclera: Conjunctivae normal.  Neck:     Musculoskeletal: Normal range of motion.  Cardiovascular:     Rate and Rhythm: Normal rate and regular rhythm.     Heart sounds: Normal heart sounds. No murmur. No friction rub. No gallop.   Pulmonary:     Effort: Pulmonary effort is normal. No respiratory distress.     Breath sounds: Normal breath sounds. No wheezing or rales.    Abdominal:     General: There is no distension.     Palpations: Abdomen is soft.     Tenderness: There is no abdominal tenderness. There is no guarding.  Musculoskeletal:     Right foot: Tenderness and bony tenderness (2nd, 3rd MTP with tenderness, no erythema, no fluctuance, pain with movements) present.  Skin:    General: Skin is warm and dry.  Neurological:     Mental Status: He is alert and oriented to person, place, and time.      ED Treatments / Results  Labs (all labs ordered are listed, but only abnormal results are displayed) Labs Reviewed - No data to display  EKG None  Radiology Dg Foot Complete Left  Result Date: 03/27/2018 CLINICAL DATA:  38 year old male with a history of pain in the second toe left foot EXAM: LEFT FOOT - COMPLETE 3+ VIEW COMPARISON:  None. FINDINGS: No acute displaced fracture. No focal soft tissue swelling. No radiopaque foreign body. No erosive bony changes. No subluxation/dislocation. No significant degenerative changes. IMPRESSION: Negative for acute bony abnormality Electronically Signed   By: Gilmer Mor D.O.   On: 03/27/2018 10:43    Procedures Procedures (including critical care time)  Medications Ordered in ED Medications  ketorolac (TORADOL) injection 60 mg (60 mg Intramuscular Given 03/27/18 1028)  dexamethasone (DECADRON) tablet 10 mg (10 mg Oral Given 03/27/18 1115)     Initial Impression / Assessment and Plan / ED Course  I have reviewed the triage vital signs and the nursing notes.  Pertinent labs & imaging results that were available during my care of the patient were reviewed by me and considered in my medical decision making (see chart for details).    38yo male presents with concern for left foot pain. Reports waking this AM with severe pain to the plantar surface of his foot just proximal to his 2-4th toes.  NO erythema, no sign of abscess or cellulitis. Low suspicion for septic arthritis by history and physical.  XR  without fx.  Possible gout, plantar fasciitis, or other inflammatory arthritis. Given colchicine, naproxen rx and dose of steroid in ED. Recommend PCP and podiatry follow up.  Final Clinical Impressions(s) / ED Diagnoses   Final diagnoses:  Foot pain, right    ED Discharge Orders         Ordered    colchicine 0.6 MG tablet  Daily     03/27/18 1106    naproxen (NAPROSYN) 500 MG tablet  2 times daily with meals     03/27/18 1109  Alvira Monday, MD 03/27/18 [phone]

## 2018-04-01 ENCOUNTER — Encounter: Payer: Self-pay | Admitting: Family Medicine

## 2018-04-09 ENCOUNTER — Ambulatory Visit: Payer: Self-pay | Admitting: Family Medicine

## 2018-04-17 ENCOUNTER — Encounter: Payer: Self-pay | Admitting: Family Medicine

## 2018-04-17 ENCOUNTER — Ambulatory Visit: Payer: BLUE CROSS/BLUE SHIELD | Admitting: Family Medicine

## 2018-04-17 VITALS — BP 128/82 | HR 72 | Temp 98.6°F | Ht 72.75 in | Wt 261.0 lb

## 2018-04-17 DIAGNOSIS — F430 Acute stress reaction: Secondary | ICD-10-CM | POA: Diagnosis not present

## 2018-04-17 DIAGNOSIS — R3121 Asymptomatic microscopic hematuria: Secondary | ICD-10-CM

## 2018-04-17 DIAGNOSIS — R319 Hematuria, unspecified: Secondary | ICD-10-CM | POA: Insufficient documentation

## 2018-04-17 LAB — POC URINALSYSI DIPSTICK (AUTOMATED)
Bilirubin, UA: NEGATIVE
Blood, UA: NEGATIVE
Glucose, UA: NEGATIVE
Ketones, UA: NEGATIVE
Leukocytes, UA: NEGATIVE
Nitrite, UA: NEGATIVE
Protein, UA: NEGATIVE
Spec Grav, UA: 1.025
Urobilinogen, UA: 0.2 U/dL
pH, UA: 6

## 2018-04-17 NOTE — Progress Notes (Signed)
Subjective:    Patient ID: Andrew Cookeyimothy Teems, male    DOB: 1980/05/25, 38 y.o.   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)  Self care- is a stress eater  This is frustrating   Not exercising right now  Wants to get back into it (had a trainer)   No drugs Alcohol - social   (not a coping mechanism)  No issues with it   Tries to talk himself through things and make game plans    Using CBD oil that is helping   Considering seeing a therapist - would like that   Hates taking medication if he does not have to   BP Readings from Last 3 Encounters:  04/17/18 128/82  03/27/18 (!) 135/94  02/06/18 104/70   Pulse Readings from Last 3 Encounters:  04/17/18 72  03/27/18 66  02/06/18 63   Also a little blood in urine one time  Last night  No other symptoms  No frequency or urgency  No burning  No d/c   UA: Results for orders placed or performed in visit on 04/17/18  POCT Urinalysis Dipstick (Automated)  Result Value Ref Range   Color, UA Yellow    Clarity, UA Clear    Glucose, UA Negative Negative   Bilirubin, UA Negative    Ketones, UA Negative    Spec Grav, UA 1.025 1.010 - 1.025   Blood, UA Negative    pH, UA 6.0 5.0 - 8.0   Protein, UA Negative Negative   Urobilinogen, UA 0.2 0.2 or 1.0 E.U./dL   Nitrite, UA Negative    Leukocytes, UA Negative Negative      Patient Active Problem List   Diagnosis Date Noted  . Blood in urine 04/17/2018  . Stress reaction 04/17/2018  . Hyperlipidemia 02/06/2018  .  Great toe pain, left 02/06/2018  . Medication adverse effect 04/02/2015  . ADD (attention deficit disorder) 06/18/2013  . Screen for STD (sexually transmitted disease) 06/18/2013  . Exposure to STD 12/20/2012  . Routine general medical examination at a health care facility 09/02/2012   Past Medical History:  Diagnosis Date  . History of petit-mal seizures    as a child no seizures in over 20 years   History reviewed. No pertinent surgical history. Social History   Tobacco Use  . Smoking status: Never Smoker  . Smokeless tobacco: Never Used  Substance Use Topics  . Alcohol use: Yes    Comment: occasional  . Drug use: No   Family History  Problem Relation Age of Onset  . Alcohol abuse Other        Uncle  . Arthritis Maternal Grandmother   . Arthritis Paternal Grandmother   . Heart disease Paternal Grandfather        MI, open heart surgery   . Heart attack Paternal Grandfather   . High blood pressure Father   . High blood pressure  Other        all grandparents, and uncles and other relatives   No Known Allergies Current Outpatient Medications on File Prior to Visit  Medication Sig Dispense Refill  . amphetamine-dextroamphetamine (ADDERALL XR) 20 MG 24 hr capsule Take 1 capsule (20 mg total) by mouth daily. 30 capsule 0  . colchicine 0.6 MG tablet Take 1 tablet (0.6 mg total) by mouth daily. Take 1.2mg  (2 tablets) followed by 1 .6mg  tablet one hour later. 3 tablet 0  . emtricitabine-tenofovir (TRUVADA) 200-300 MG tablet Take 1 tablet by mouth daily. 90 tablet 3  . naproxen (NAPROSYN) 500 MG tablet Take 1 tablet (500 mg total) by mouth 2 (two) times daily with a meal. 14 tablet 0   No current facility-administered medications on file prior to visit.     Review of Systems  Constitutional: Negative for activity change, appetite change, fatigue, fever and unexpected weight change.  HENT: Negative for congestion, rhinorrhea, sore throat and trouble swallowing.   Eyes: Negative  for pain, redness, itching and visual disturbance.  Respiratory: Negative for cough, chest tightness, shortness of breath and wheezing.   Cardiovascular: Negative for chest pain and palpitations.  Gastrointestinal: Negative for abdominal pain, blood in stool, constipation, diarrhea and nausea.  Endocrine: Negative for cold intolerance, heat intolerance, polydipsia and polyuria.  Genitourinary: Positive for hematuria. Negative for difficulty urinating, discharge, dysuria, flank pain, frequency, penile pain, penile swelling and urgency.       One episode of scant blood in urine  Musculoskeletal: Negative for arthralgias, joint swelling and myalgias.  Skin: Negative for pallor and rash.  Neurological: Negative for dizziness, tremors, weakness, numbness and headaches.  Hematological: Negative for adenopathy. Does not bruise/bleed easily.  Psychiatric/Behavioral: Negative for agitation, confusion, decreased concentration, dysphoric mood, self-injury and suicidal ideas. The patient is nervous/anxious.        Objective:   Physical Exam Constitutional:      General: He is not in acute distress.    Appearance: Normal appearance. He is well-developed. He is obese. He is not ill-appearing.  HENT:     Head: Normocephalic and atraumatic.     Mouth/Throat:     Mouth: Mucous membranes are moist.     Pharynx: Oropharynx is clear.  Eyes:     Conjunctiva/sclera: Conjunctivae normal.     Pupils: Pupils are equal, round, and reactive to light.  Neck:     Musculoskeletal: Normal range of motion and neck supple.     Thyroid: No thyromegaly.     Vascular: No carotid bruit or JVD.  Cardiovascular:     Rate and Rhythm: Normal rate and regular rhythm.     Pulses: Normal pulses.     Heart sounds: Normal heart sounds. No gallop.   Pulmonary:     Effort: Pulmonary effort is normal. No respiratory distress.     Breath sounds: Normal breath sounds. No wheezing or rales.  Abdominal:     General: There is no  abdominal bruit.  Musculoskeletal:     Right lower leg: No edema.     Left lower leg: No edema.  Lymphadenopathy:     Cervical: No cervical adenopathy.  Skin:    General: Skin is warm and dry.     Capillary Refill: Capillary refill takes less than 2 seconds.     Coloration: Skin is not pale.     Findings: No rash.  Neurological:     General: No focal deficit present.     Mental Status: He is  alert.     Deep Tendon Reflexes: Reflexes are normal and symmetric.     Comments: No tremor   Psychiatric:        Attention and Perception: Attention and perception normal.        Mood and Affect: Mood is anxious. Mood is not depressed. Affect is not labile, flat or tearful.        Speech: Speech normal.        Behavior: Behavior normal.        Thought Content: Thought content normal.        Cognition and Memory: Cognition normal.     Comments: Discusses stressors and symptoms candidly           Assessment & Plan:   Problem List Items Addressed This Visit      Other   Blood in urine    One time at home  UA today is clear Will alert [place] if this re occurs Does not desire std screen today       Relevant Orders   POCT Urinalysis Dipstick (Automated) (Completed)   Stress reaction - Primary    With anxious symptoms  Per pt - he has always been anxious but worse lately  Reviewed stressors/ coping techniques/symptoms/ support sources/ tx options and side effects in detail today A little help from CBD oil  Does not desire px med at this time  Ref done to counselor  Enc good self care Enc him to return to exercise        Relevant Orders   Ambulatory referral to Psychology

## 2018-04-17 NOTE — Patient Instructions (Signed)
Practice good self care! Get back to exercise   I did a counseling referral and the office will call you   Let me know if any problems   Let's check a urinalysis  If the blood happens again please let me know

## 2018-04-18 NOTE — Assessment & Plan Note (Signed)
One time at home  UA today is clear Will alert [place] if this re occurs Does not desire std screen today

## 2018-04-18 NOTE — Assessment & Plan Note (Signed)
With anxious symptoms  Per pt - he has always been anxious but worse lately  Reviewed stressors/ coping techniques/symptoms/ support sources/ tx options and side effects in detail today A little help from CBD oil  Does not desire px med at this time  Ref done to counselor  Enc good self care Enc him to return to exercise

## 2018-08-08 ENCOUNTER — Ambulatory Visit (INDEPENDENT_AMBULATORY_CARE_PROVIDER_SITE_OTHER): Payer: BLUE CROSS/BLUE SHIELD | Admitting: Psychology

## 2018-08-08 DIAGNOSIS — F411 Generalized anxiety disorder: Secondary | ICD-10-CM

## 2018-08-22 ENCOUNTER — Ambulatory Visit (INDEPENDENT_AMBULATORY_CARE_PROVIDER_SITE_OTHER): Payer: BC Managed Care – PPO | Admitting: Psychology

## 2018-08-22 DIAGNOSIS — F411 Generalized anxiety disorder: Secondary | ICD-10-CM | POA: Diagnosis not present

## 2018-08-28 ENCOUNTER — Ambulatory Visit (INDEPENDENT_AMBULATORY_CARE_PROVIDER_SITE_OTHER): Payer: BC Managed Care – PPO | Admitting: Psychology

## 2018-08-28 DIAGNOSIS — F411 Generalized anxiety disorder: Secondary | ICD-10-CM

## 2018-09-04 ENCOUNTER — Ambulatory Visit (INDEPENDENT_AMBULATORY_CARE_PROVIDER_SITE_OTHER): Payer: BC Managed Care – PPO | Admitting: Psychology

## 2018-09-04 DIAGNOSIS — F411 Generalized anxiety disorder: Secondary | ICD-10-CM | POA: Diagnosis not present

## 2018-09-11 ENCOUNTER — Ambulatory Visit (INDEPENDENT_AMBULATORY_CARE_PROVIDER_SITE_OTHER): Payer: BC Managed Care – PPO | Admitting: Psychology

## 2018-09-11 DIAGNOSIS — F411 Generalized anxiety disorder: Secondary | ICD-10-CM | POA: Diagnosis not present

## 2018-09-18 ENCOUNTER — Ambulatory Visit: Payer: BC Managed Care – PPO | Admitting: Psychology

## 2018-09-25 ENCOUNTER — Ambulatory Visit (INDEPENDENT_AMBULATORY_CARE_PROVIDER_SITE_OTHER): Payer: BC Managed Care – PPO | Admitting: Psychology

## 2018-09-25 DIAGNOSIS — F411 Generalized anxiety disorder: Secondary | ICD-10-CM

## 2018-10-02 ENCOUNTER — Ambulatory Visit (INDEPENDENT_AMBULATORY_CARE_PROVIDER_SITE_OTHER): Payer: BC Managed Care – PPO | Admitting: Psychology

## 2018-10-02 DIAGNOSIS — F411 Generalized anxiety disorder: Secondary | ICD-10-CM | POA: Diagnosis not present

## 2018-10-09 ENCOUNTER — Ambulatory Visit (INDEPENDENT_AMBULATORY_CARE_PROVIDER_SITE_OTHER): Payer: BC Managed Care – PPO | Admitting: Psychology

## 2018-10-09 DIAGNOSIS — F411 Generalized anxiety disorder: Secondary | ICD-10-CM | POA: Diagnosis not present

## 2018-10-16 ENCOUNTER — Ambulatory Visit (INDEPENDENT_AMBULATORY_CARE_PROVIDER_SITE_OTHER): Payer: BC Managed Care – PPO | Admitting: Psychology

## 2018-10-16 DIAGNOSIS — F411 Generalized anxiety disorder: Secondary | ICD-10-CM | POA: Diagnosis not present

## 2018-10-23 ENCOUNTER — Ambulatory Visit: Payer: BC Managed Care – PPO | Admitting: Psychology

## 2018-10-30 ENCOUNTER — Ambulatory Visit: Payer: BC Managed Care – PPO | Admitting: Psychology

## 2018-11-13 ENCOUNTER — Ambulatory Visit (INDEPENDENT_AMBULATORY_CARE_PROVIDER_SITE_OTHER): Payer: BC Managed Care – PPO | Admitting: Psychology

## 2018-11-13 DIAGNOSIS — F411 Generalized anxiety disorder: Secondary | ICD-10-CM | POA: Diagnosis not present

## 2018-11-26 ENCOUNTER — Ambulatory Visit (INDEPENDENT_AMBULATORY_CARE_PROVIDER_SITE_OTHER): Payer: BC Managed Care – PPO | Admitting: Psychology

## 2018-11-26 DIAGNOSIS — F411 Generalized anxiety disorder: Secondary | ICD-10-CM | POA: Diagnosis not present

## 2018-12-04 ENCOUNTER — Ambulatory Visit: Payer: BC Managed Care – PPO | Admitting: Psychology

## 2018-12-05 ENCOUNTER — Ambulatory Visit (INDEPENDENT_AMBULATORY_CARE_PROVIDER_SITE_OTHER): Payer: BC Managed Care – PPO | Admitting: Psychology

## 2018-12-05 DIAGNOSIS — F411 Generalized anxiety disorder: Secondary | ICD-10-CM

## 2018-12-11 ENCOUNTER — Ambulatory Visit (INDEPENDENT_AMBULATORY_CARE_PROVIDER_SITE_OTHER): Payer: BC Managed Care – PPO | Admitting: Psychology

## 2018-12-11 DIAGNOSIS — F411 Generalized anxiety disorder: Secondary | ICD-10-CM | POA: Diagnosis not present

## 2018-12-18 ENCOUNTER — Ambulatory Visit (INDEPENDENT_AMBULATORY_CARE_PROVIDER_SITE_OTHER): Payer: BC Managed Care – PPO | Admitting: Psychology

## 2018-12-18 DIAGNOSIS — F411 Generalized anxiety disorder: Secondary | ICD-10-CM

## 2018-12-25 ENCOUNTER — Ambulatory Visit (INDEPENDENT_AMBULATORY_CARE_PROVIDER_SITE_OTHER): Payer: BC Managed Care – PPO | Admitting: Psychology

## 2018-12-25 DIAGNOSIS — F411 Generalized anxiety disorder: Secondary | ICD-10-CM | POA: Diagnosis not present

## 2019-01-01 ENCOUNTER — Ambulatory Visit: Payer: BC Managed Care – PPO | Admitting: Psychology

## 2019-01-08 ENCOUNTER — Ambulatory Visit (INDEPENDENT_AMBULATORY_CARE_PROVIDER_SITE_OTHER): Payer: BC Managed Care – PPO | Admitting: Psychology

## 2019-01-08 DIAGNOSIS — F411 Generalized anxiety disorder: Secondary | ICD-10-CM

## 2019-01-15 ENCOUNTER — Ambulatory Visit (INDEPENDENT_AMBULATORY_CARE_PROVIDER_SITE_OTHER): Payer: BC Managed Care – PPO | Admitting: Psychology

## 2019-01-15 DIAGNOSIS — F411 Generalized anxiety disorder: Secondary | ICD-10-CM

## 2019-01-17 IMAGING — CR DG FOOT COMPLETE 3+V*L*
3 series · 3 of 3 positions shown · non-contrast
Comparison: None.

CLINICAL DATA: 37-year-old male with a history of pain in the
second toe left foot

EXAM:
LEFT FOOT - COMPLETE 3+ VIEW

[t foot ap left]
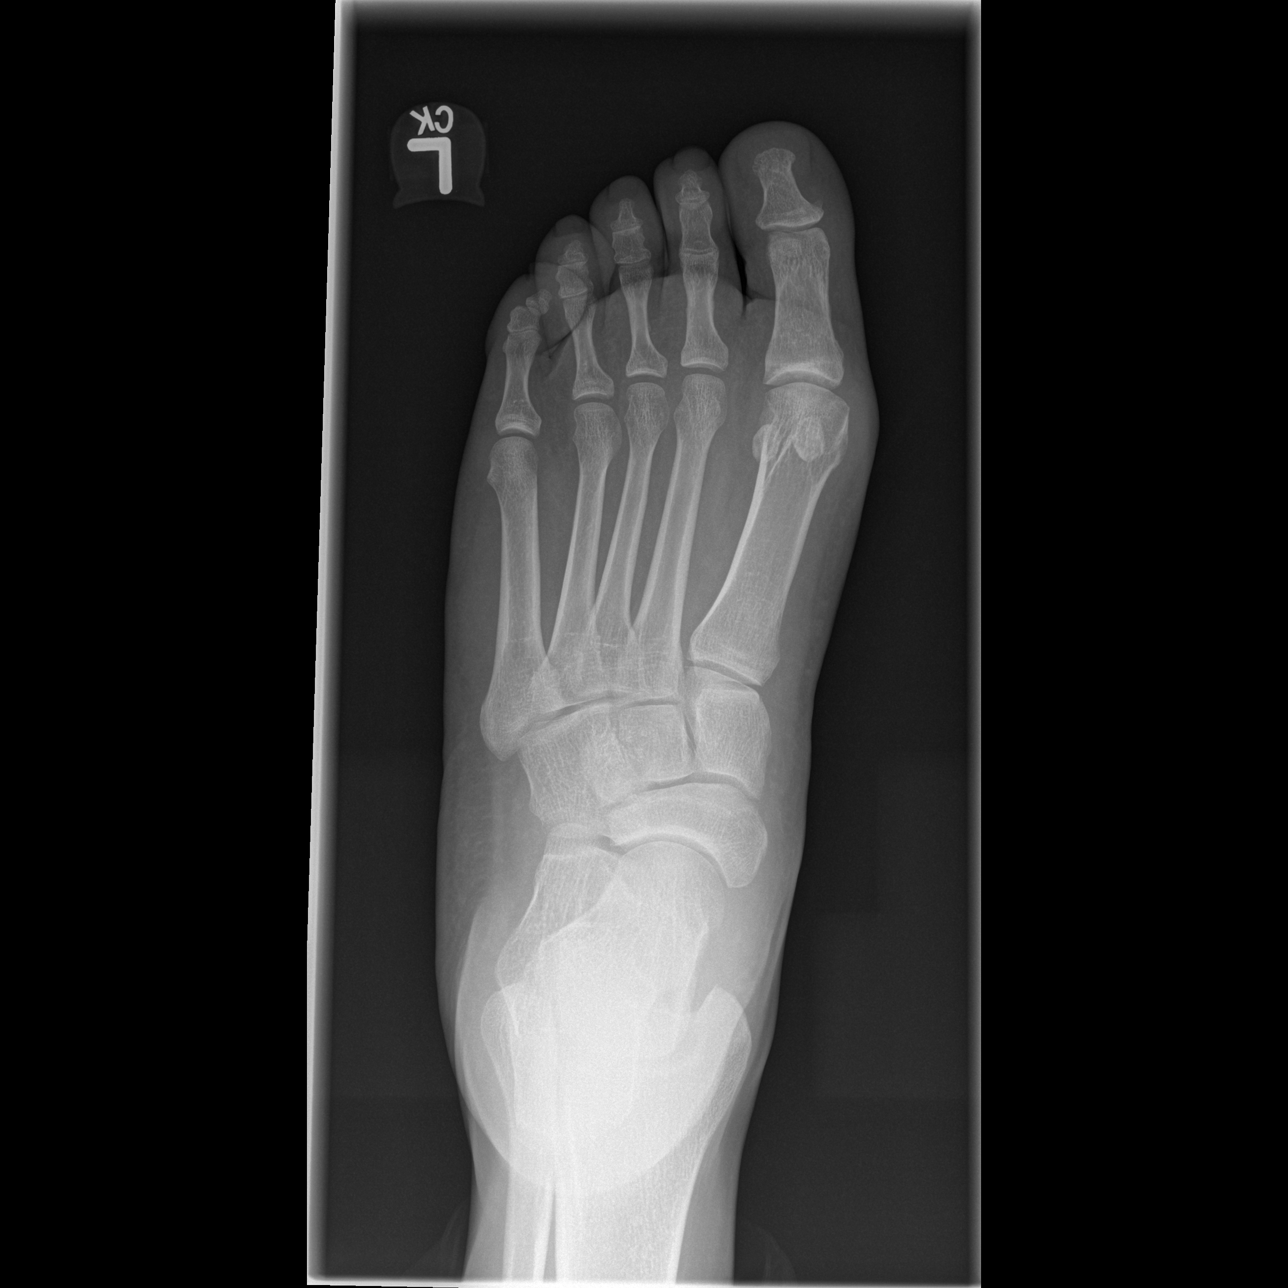

[t foot oblique left]
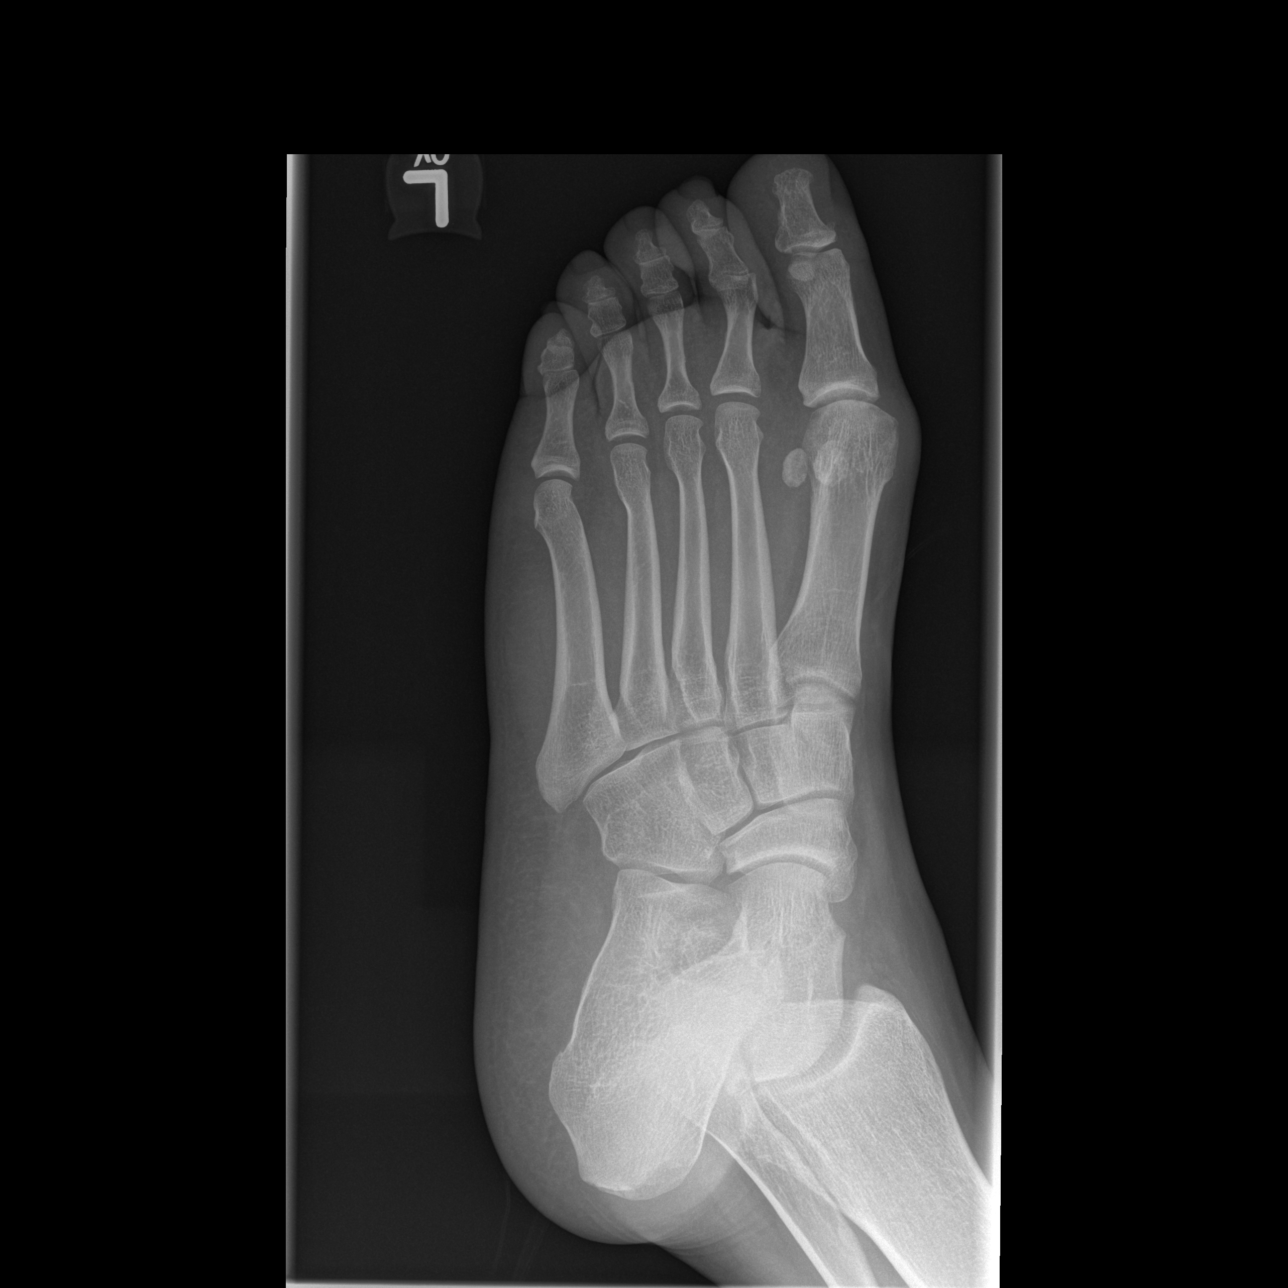

[t foot lat left]
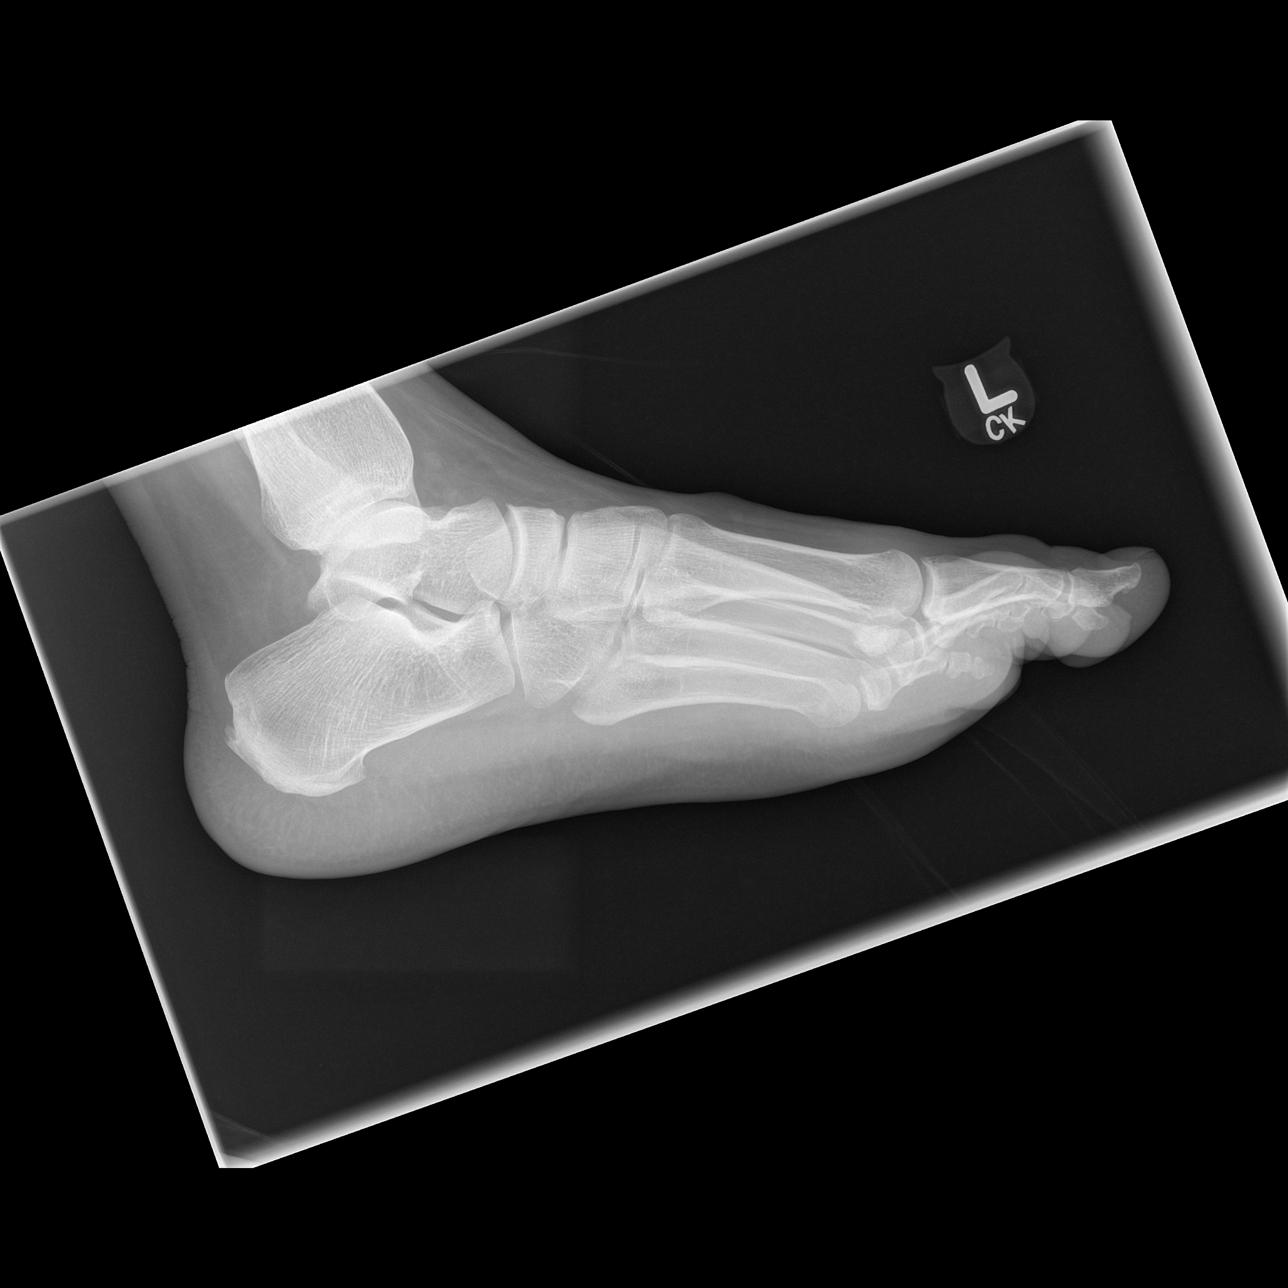

[3 of 3 positions shown; findings below may reference images not displayed]

FINDINGS: No acute displaced fracture. No focal soft tissue swelling. No
radiopaque foreign body. No erosive bony changes. No
subluxation/dislocation. No significant degenerative changes.
IMPRESSION: Negative for acute bony abnormality

## 2019-01-22 ENCOUNTER — Ambulatory Visit: Payer: BC Managed Care – PPO | Admitting: Psychology

## 2019-01-29 ENCOUNTER — Ambulatory Visit (INDEPENDENT_AMBULATORY_CARE_PROVIDER_SITE_OTHER): Payer: BC Managed Care – PPO | Admitting: Psychology

## 2019-01-29 DIAGNOSIS — F411 Generalized anxiety disorder: Secondary | ICD-10-CM | POA: Diagnosis not present

## 2019-02-05 ENCOUNTER — Ambulatory Visit: Payer: BC Managed Care – PPO | Admitting: Psychology

## 2019-02-19 ENCOUNTER — Ambulatory Visit (INDEPENDENT_AMBULATORY_CARE_PROVIDER_SITE_OTHER): Payer: BC Managed Care – PPO | Admitting: Psychology

## 2019-02-19 DIAGNOSIS — F411 Generalized anxiety disorder: Secondary | ICD-10-CM

## 2019-02-26 ENCOUNTER — Ambulatory Visit: Payer: BC Managed Care – PPO | Admitting: Psychology

## 2019-03-05 ENCOUNTER — Ambulatory Visit (INDEPENDENT_AMBULATORY_CARE_PROVIDER_SITE_OTHER): Payer: BC Managed Care – PPO | Admitting: Psychology

## 2019-03-05 DIAGNOSIS — F411 Generalized anxiety disorder: Secondary | ICD-10-CM

## 2019-03-26 ENCOUNTER — Ambulatory Visit (INDEPENDENT_AMBULATORY_CARE_PROVIDER_SITE_OTHER): Payer: 59 | Admitting: Psychology

## 2019-03-26 ENCOUNTER — Encounter: Payer: Self-pay | Admitting: Family Medicine

## 2019-03-26 DIAGNOSIS — F411 Generalized anxiety disorder: Secondary | ICD-10-CM

## 2019-03-27 ENCOUNTER — Ambulatory Visit: Payer: Self-pay | Attending: Internal Medicine

## 2019-04-02 ENCOUNTER — Ambulatory Visit (INDEPENDENT_AMBULATORY_CARE_PROVIDER_SITE_OTHER): Payer: 59 | Admitting: Psychology

## 2019-04-02 DIAGNOSIS — F411 Generalized anxiety disorder: Secondary | ICD-10-CM | POA: Diagnosis not present

## 2019-04-09 ENCOUNTER — Ambulatory Visit: Payer: BC Managed Care – PPO | Admitting: Psychology

## 2019-04-16 ENCOUNTER — Ambulatory Visit (INDEPENDENT_AMBULATORY_CARE_PROVIDER_SITE_OTHER): Payer: 59 | Admitting: Psychology

## 2019-04-16 DIAGNOSIS — F411 Generalized anxiety disorder: Secondary | ICD-10-CM

## 2019-04-18 ENCOUNTER — Encounter: Payer: Self-pay | Admitting: Family Medicine

## 2019-04-21 ENCOUNTER — Other Ambulatory Visit: Payer: Self-pay

## 2019-04-21 ENCOUNTER — Encounter: Payer: Self-pay | Admitting: Family Medicine

## 2019-04-21 ENCOUNTER — Ambulatory Visit (INDEPENDENT_AMBULATORY_CARE_PROVIDER_SITE_OTHER): Payer: 59 | Admitting: Family Medicine

## 2019-04-21 DIAGNOSIS — J069 Acute upper respiratory infection, unspecified: Secondary | ICD-10-CM | POA: Diagnosis not present

## 2019-04-21 MED ORDER — BENZONATATE 200 MG PO CAPS: 200.0000 mg | ORAL_CAPSULE | Freq: Three times a day (TID) | ORAL | 1 refills | Status: AC | PRN

## 2019-04-21 NOTE — Progress Notes (Signed)
Virtual Visit via Video Note  I connected with Andrew Ball on 04/21/19 at 11:30 AM EST by a video enabled telemedicine application and verified that I am speaking with the correct person using two identifiers.  Location: Patient: home  Provider: office    I discussed the limitations of evaluation and management by telemedicine and the availability of in person appointments. The patient expressed understanding and agreed to proceed.  Parties involved in encounter  Patient: Andrew Ball  Provider:  Loura Pardon MD    History of Present Illness: Last weekend his best friend came back from travel with is girlfriend   She tested positive for covid the next day  She had been in his place but he did not see her   He got tested - early -neg Rapid on Thursday -also negative   Symptoms started Cough -improved from what it was  Productive in am / then more dry  No wheezing or sob  Fever -now better Congestion-- eased  No facial pain   Headache-frontal  Fatigue  Chills -now better  Sore throat comes and goes (worse in the am)   Never lost smell or taste   Fever broke yesterday am Temp: 98.2 F (36.8 C)   Plans to get tested today - rapid at [facility]   Drinking lots of fluids  Not much appetite- makes himself eat a little   His friend had symptoms as well- getting tested as well   Taking C Tylenol  advil  mucinex -is helpful and also for ST   Isolating himself in [place]   Patient Active Problem List   Diagnosis Date Noted  . Viral URI with cough 04/21/2019  . Blood in urine 04/17/2018  . Stress reaction 04/17/2018  . Hyperlipidemia 02/06/2018  . Great toe pain, left 02/06/2018  . Medication adverse effect 04/02/2015  . ADD (attention deficit disorder) 06/18/2013  . Screen for STD (sexually transmitted disease) 06/18/2013  . Exposure to STD 12/20/2012  . Routine general medical examination at a health care facility 09/02/2012   Past Medical History:   Diagnosis Date  . History of petit-mal seizures    as a child no seizures in over 20 years   History reviewed. No pertinent surgical history. Social History   Tobacco Use  . Smoking status: Never Smoker  . Smokeless tobacco: Never Used  Substance Use Topics  . Alcohol use: Yes    Comment: occasional  . Drug use: No   Family History  Problem Relation Age of Onset  . Alcohol abuse Other        Uncle  . Arthritis Maternal Grandmother   . Arthritis Paternal Grandmother   . Heart disease Paternal Grandfather        MI, open heart surgery   . Heart attack Paternal Grandfather   . High blood pressure Father   . High blood pressure Other        all grandparents, and uncles and other relatives   No Known Allergies Current Outpatient Medications on File Prior to Visit  Medication Sig Dispense Refill  . amphetamine-dextroamphetamine (ADDERALL XR) 20 MG 24 hr capsule Take 1 capsule (20 mg total) by mouth daily. 30 capsule 0  . emtricitabine-tenofovir (TRUVADA) 200-300 MG tablet Take 1 tablet by mouth daily. 90 tablet 3  . naproxen (NAPROSYN) 500 MG tablet Take 1 tablet (500 mg total) by mouth 2 (two) times daily with a meal. 14 tablet 0   No current facility-administered medications on file prior to  visit.   Review of Systems  Constitutional: Positive for malaise/fatigue. Negative for chills and fever.  HENT: Positive for congestion and sore throat. Negative for ear pain and sinus pain.   Eyes: Negative for blurred vision, discharge and redness.  Respiratory: Positive for cough and sputum production. Negative for shortness of breath and stridor.   Cardiovascular: Negative for chest pain, palpitations and leg swelling.  Gastrointestinal: Negative for abdominal pain, diarrhea, nausea and vomiting.  Musculoskeletal: Negative for myalgias.  Skin: Negative for rash.  Neurological: Positive for headaches. Negative for dizziness.       Observations/Objective: Patient appears well, in  no distress (but seems generally fatigued)  Weight is baseline  No facial swelling or asymmetry Normal voice-not hoarse and no slurred speech No obvious tremor or mobility impairment Moving neck and UEs normally Able to hear the call well  No cough or shortness of breath during interview  Talkative and mentally sharp with no cognitive changes No skin changes on face or neck , no rash or pallor Affect is normal    Assessment and Plan:   Follow Up Instructions: Drink fluids and rest  Use tylenol for fever and headache and sore throat  mucinex DM for cough and congestion  I also sent in tessalon for cough -take as needed  Continue to isolate yourself   Let us know when your covid test result returns Also if any symptoms worsen or any new ones (like shortness of breath or diarrhea    I discussed the assessment and treatment plan with the patient. The patient was provided an opportunity to ask questions and all were answered. The patient agreed with the plan and demonstrated an understanding of the instructions.   The patient was advised to call back or seek an in-person evaluation if the symptoms worsen or if the condition fails to improve as anticipated.     Roxy Manns, MD

## 2019-04-21 NOTE — Patient Instructions (Signed)
Drink fluids and rest  Use tylenol for fever and headache and sore throat  mucinex DM for cough and congestion  I also sent in tessalon for cough -take as needed  Continue to isolate yourself   Let us know when your covid test result returns Also if any symptoms worsen or any new ones (like shortness of breath or diarrhea

## 2019-04-23 ENCOUNTER — Ambulatory Visit: Payer: Self-pay | Admitting: Psychology

## 2019-04-30 ENCOUNTER — Ambulatory Visit: Payer: 59 | Admitting: Psychology

## 2019-05-07 ENCOUNTER — Ambulatory Visit: Payer: Self-pay | Admitting: Psychology

## 2019-05-14 ENCOUNTER — Ambulatory Visit: Payer: 59 | Admitting: Psychology

## 2019-05-16 ENCOUNTER — Ambulatory Visit (INDEPENDENT_AMBULATORY_CARE_PROVIDER_SITE_OTHER): Payer: 59 | Admitting: Psychology

## 2019-05-16 DIAGNOSIS — F411 Generalized anxiety disorder: Secondary | ICD-10-CM

## 2019-05-21 ENCOUNTER — Ambulatory Visit (INDEPENDENT_AMBULATORY_CARE_PROVIDER_SITE_OTHER): Payer: 59 | Admitting: Psychology

## 2019-05-21 DIAGNOSIS — F411 Generalized anxiety disorder: Secondary | ICD-10-CM

## 2019-05-28 ENCOUNTER — Ambulatory Visit (INDEPENDENT_AMBULATORY_CARE_PROVIDER_SITE_OTHER): Payer: 59 | Admitting: Psychology

## 2019-05-28 DIAGNOSIS — F411 Generalized anxiety disorder: Secondary | ICD-10-CM | POA: Diagnosis not present

## 2019-06-04 ENCOUNTER — Ambulatory Visit (INDEPENDENT_AMBULATORY_CARE_PROVIDER_SITE_OTHER): Payer: 59 | Admitting: Psychology

## 2019-06-04 DIAGNOSIS — F411 Generalized anxiety disorder: Secondary | ICD-10-CM | POA: Diagnosis not present

## 2019-06-11 ENCOUNTER — Ambulatory Visit (INDEPENDENT_AMBULATORY_CARE_PROVIDER_SITE_OTHER): Payer: 59 | Admitting: Psychology

## 2019-06-11 DIAGNOSIS — F411 Generalized anxiety disorder: Secondary | ICD-10-CM | POA: Diagnosis not present

## 2019-06-18 ENCOUNTER — Ambulatory Visit (INDEPENDENT_AMBULATORY_CARE_PROVIDER_SITE_OTHER): Payer: 59 | Admitting: Psychology

## 2019-06-18 DIAGNOSIS — F411 Generalized anxiety disorder: Secondary | ICD-10-CM | POA: Diagnosis not present

## 2019-06-25 ENCOUNTER — Ambulatory Visit: Payer: 59 | Admitting: Psychology

## 2019-07-02 ENCOUNTER — Ambulatory Visit (INDEPENDENT_AMBULATORY_CARE_PROVIDER_SITE_OTHER): Payer: 59 | Admitting: Psychology

## 2019-07-02 DIAGNOSIS — F411 Generalized anxiety disorder: Secondary | ICD-10-CM | POA: Diagnosis not present

## 2019-07-09 ENCOUNTER — Ambulatory Visit (INDEPENDENT_AMBULATORY_CARE_PROVIDER_SITE_OTHER): Payer: 59 | Admitting: Psychology

## 2019-07-09 DIAGNOSIS — F411 Generalized anxiety disorder: Secondary | ICD-10-CM

## 2019-07-16 ENCOUNTER — Ambulatory Visit (INDEPENDENT_AMBULATORY_CARE_PROVIDER_SITE_OTHER): Payer: 59 | Admitting: Psychology

## 2019-07-16 DIAGNOSIS — F411 Generalized anxiety disorder: Secondary | ICD-10-CM | POA: Diagnosis not present

## 2019-07-23 ENCOUNTER — Ambulatory Visit (INDEPENDENT_AMBULATORY_CARE_PROVIDER_SITE_OTHER): Payer: 59 | Admitting: Psychology

## 2019-07-23 DIAGNOSIS — F411 Generalized anxiety disorder: Secondary | ICD-10-CM

## 2019-07-30 ENCOUNTER — Ambulatory Visit (INDEPENDENT_AMBULATORY_CARE_PROVIDER_SITE_OTHER): Payer: 59 | Admitting: Psychology

## 2019-07-30 DIAGNOSIS — F411 Generalized anxiety disorder: Secondary | ICD-10-CM | POA: Diagnosis not present

## 2019-08-06 ENCOUNTER — Ambulatory Visit: Payer: 59 | Admitting: Psychology

## 2019-08-13 ENCOUNTER — Ambulatory Visit (INDEPENDENT_AMBULATORY_CARE_PROVIDER_SITE_OTHER): Payer: 59 | Admitting: Psychology

## 2019-08-13 DIAGNOSIS — F411 Generalized anxiety disorder: Secondary | ICD-10-CM

## 2019-08-20 ENCOUNTER — Ambulatory Visit (INDEPENDENT_AMBULATORY_CARE_PROVIDER_SITE_OTHER): Payer: 59 | Admitting: Psychology

## 2019-08-20 DIAGNOSIS — F411 Generalized anxiety disorder: Secondary | ICD-10-CM | POA: Diagnosis not present

## 2019-09-03 ENCOUNTER — Ambulatory Visit (INDEPENDENT_AMBULATORY_CARE_PROVIDER_SITE_OTHER): Payer: 59 | Admitting: Psychology

## 2019-09-03 DIAGNOSIS — F411 Generalized anxiety disorder: Secondary | ICD-10-CM | POA: Diagnosis not present

## 2019-09-10 ENCOUNTER — Ambulatory Visit: Payer: 59 | Admitting: Psychology

## 2019-09-17 ENCOUNTER — Ambulatory Visit: Payer: 59 | Admitting: Psychology

## 2019-10-15 ENCOUNTER — Ambulatory Visit (INDEPENDENT_AMBULATORY_CARE_PROVIDER_SITE_OTHER): Payer: 59 | Admitting: Psychology

## 2019-10-15 DIAGNOSIS — F411 Generalized anxiety disorder: Secondary | ICD-10-CM | POA: Diagnosis not present

## 2019-10-30 ENCOUNTER — Other Ambulatory Visit: Payer: Self-pay | Admitting: Family Medicine

## 2019-10-31 NOTE — Telephone Encounter (Signed)
Electronic refill request. Adderall XR Last office visit:    

## 2019-11-26 ENCOUNTER — Ambulatory Visit (INDEPENDENT_AMBULATORY_CARE_PROVIDER_SITE_OTHER): Payer: 59 | Admitting: Psychology

## 2019-11-26 DIAGNOSIS — F411 Generalized anxiety disorder: Secondary | ICD-10-CM

## 2019-12-03 ENCOUNTER — Ambulatory Visit: Payer: 59 | Admitting: Psychology

## 2019-12-04 ENCOUNTER — Ambulatory Visit: Payer: 59 | Admitting: Psychology

## 2019-12-10 ENCOUNTER — Ambulatory Visit (INDEPENDENT_AMBULATORY_CARE_PROVIDER_SITE_OTHER): Payer: 59 | Admitting: Psychology

## 2019-12-10 DIAGNOSIS — F411 Generalized anxiety disorder: Secondary | ICD-10-CM | POA: Diagnosis not present

## 2019-12-17 ENCOUNTER — Ambulatory Visit: Payer: 59 | Admitting: Psychology

## 2019-12-23 ENCOUNTER — Ambulatory Visit: Payer: 59 | Admitting: Family Medicine

## 2019-12-24 ENCOUNTER — Ambulatory Visit (INDEPENDENT_AMBULATORY_CARE_PROVIDER_SITE_OTHER): Payer: 59 | Admitting: Psychology

## 2019-12-24 ENCOUNTER — Ambulatory Visit: Payer: 59 | Admitting: Family Medicine

## 2019-12-24 DIAGNOSIS — F411 Generalized anxiety disorder: Secondary | ICD-10-CM

## 2019-12-31 ENCOUNTER — Ambulatory Visit (INDEPENDENT_AMBULATORY_CARE_PROVIDER_SITE_OTHER): Payer: 59 | Admitting: Psychology

## 2019-12-31 DIAGNOSIS — F411 Generalized anxiety disorder: Secondary | ICD-10-CM

## 2020-01-07 ENCOUNTER — Ambulatory Visit (INDEPENDENT_AMBULATORY_CARE_PROVIDER_SITE_OTHER): Payer: 59 | Admitting: Psychology

## 2020-01-07 DIAGNOSIS — F411 Generalized anxiety disorder: Secondary | ICD-10-CM | POA: Diagnosis not present

## 2020-01-08 ENCOUNTER — Ambulatory Visit (INDEPENDENT_AMBULATORY_CARE_PROVIDER_SITE_OTHER): Payer: 59 | Admitting: Family Medicine

## 2020-01-08 ENCOUNTER — Encounter: Payer: Self-pay | Admitting: Family Medicine

## 2020-01-08 ENCOUNTER — Other Ambulatory Visit: Payer: Self-pay

## 2020-01-08 VITALS — BP 116/68 | HR 70 | Temp 97.0°F | Ht 73.0 in | Wt 278.1 lb

## 2020-01-08 DIAGNOSIS — Z0000 Encounter for general adult medical examination without abnormal findings: Secondary | ICD-10-CM

## 2020-01-08 DIAGNOSIS — Z113 Encounter for screening for infections with a predominantly sexual mode of transmission: Secondary | ICD-10-CM | POA: Diagnosis not present

## 2020-01-08 DIAGNOSIS — F430 Acute stress reaction: Secondary | ICD-10-CM

## 2020-01-08 DIAGNOSIS — F988 Other specified behavioral and emotional disorders with onset usually occurring in childhood and adolescence: Secondary | ICD-10-CM | POA: Diagnosis not present

## 2020-01-08 DIAGNOSIS — Z23 Encounter for immunization: Secondary | ICD-10-CM

## 2020-01-08 DIAGNOSIS — E669 Obesity, unspecified: Secondary | ICD-10-CM

## 2020-01-08 DIAGNOSIS — E7800 Pure hypercholesterolemia, unspecified: Secondary | ICD-10-CM | POA: Diagnosis not present

## 2020-01-08 LAB — LIPID PANEL
Cholesterol: 233 mg/dL — ABNORMAL HIGH (ref 0–200)
HDL: 47.4 mg/dL (ref >39.00)
LDL Cholesterol: 161 mg/dL — ABNORMAL HIGH (ref 0–99)
NonHDL: 185.74
Total CHOL/HDL Ratio: 5
Triglycerides: 125 mg/dL (ref 0.0–149.0)
VLDL: 25 mg/dL (ref 0.0–40.0)

## 2020-01-08 LAB — CBC WITH DIFFERENTIAL/PLATELET
Basophils Absolute: 0 10*3/uL (ref 0.0–0.1)
Basophils Relative: 0.6 % (ref 0.0–3.0)
Eosinophils Absolute: 0.1 10*3/uL (ref 0.0–0.7)
Eosinophils Relative: 2.1 % (ref 0.0–5.0)
HCT: 43.5 % (ref 39.0–52.0)
Hemoglobin: 14.4 g/dL (ref 13.0–17.0)
Lymphocytes Relative: 46.7 % — ABNORMAL HIGH (ref 12.0–46.0)
Lymphs Abs: 2.3 10*3/uL (ref 0.7–4.0)
MCHC: 33.1 g/dL (ref 30.0–36.0)
MCV: 86.3 fl (ref 78.0–100.0)
Monocytes Absolute: 0.4 10*3/uL (ref 0.1–1.0)
Monocytes Relative: 8.6 % (ref 3.0–12.0)
Neutro Abs: 2.1 10*3/uL (ref 1.4–7.7)
Neutrophils Relative %: 42 % — ABNORMAL LOW (ref 43.0–77.0)
Platelets: 252 10*3/uL (ref 150.0–400.0)
RBC: 5.04 Mil/uL (ref 4.22–5.81)
RDW: 13.2 % (ref 11.5–15.5)
WBC: 4.9 10*3/uL (ref 4.0–10.5)

## 2020-01-08 LAB — TSH: TSH: 1 u[IU]/mL (ref 0.35–4.50)

## 2020-01-08 MED ORDER — AMPHETAMINE-DEXTROAMPHET ER 20 MG PO CP24: 20.0000 mg | ORAL_CAPSULE | Freq: Every day | ORAL | 0 refills | Status: DC

## 2020-01-08 MED ORDER — EMTRICITABINE-TENOFOVIR DF 200-300 MG PO TABS: 1.0000 | ORAL_TABLET | Freq: Every day | ORAL | 3 refills | Status: DC

## 2020-01-08 NOTE — Patient Instructions (Addendum)
Labs today   For cholesterol Avoid red meat/ fried foods/ egg yolks/ fatty breakfast meats/ butter, cheese and high fat dairy/ and shellfish    Get back to exercising   Take care of yourself   Flu shot today   Get set up with the travel clinic I do recommend a covid booster

## 2020-01-08 NOTE — Progress Notes (Signed)
Subjective:    Patient ID: Andrew Ball, male    DOB: 08-12-1980, 39 y.o.   MRN: [id number]  This visit occurred during the SARS-CoV-2 public health emergency.  Safety protocols were in place, including screening questions prior to the visit, additional usage of staff PPE, and extensive cleaning of exam room while observing appropriate contact time as indicated for disinfecting solutions.    HPI  Here for health maintenance exam and to review chronic medical problems    Wt Readings from Last 3 Encounters:  01/08/20 278 lb 2 oz (126.2 kg)  04/17/18 261 lb (118.4 kg)  03/27/18 250 lb (113.4 kg)   36.69 kg/m  Planning a trip to [place] in December  Will do xmas in [place] also Will be meeting friends  Will need to get set up with travel clinic   Working a lot lately  Sees therapist once per week which is helpful   Has gained some weight  Stopped exercise during pandemic/ plans to get another trainer and watch diet  Got a [employer] bike and tonal system  Lot of stress owning a business   Flu vaccine - today  covid status -pfizer in [employer] on a booster    Tdap 6/14   Hep C screening   Prostate health  No problems /occ nocturia No fam hx of prostate or colon cancer    Has used Truvada for HIV prophylaxis  Due for labs   ADD-took adderall in the past Working more now- needs to get back on track  No sides eff in the past   Last cholesterol Lab Results  Component Value Date   CHOL 214 (H) 02/06/2018   HDL 46.10 02/06/2018   LDLCALC 147 (H) 02/06/2018   TRIG 105.0 02/06/2018   CHOLHDL 5 02/06/2018   Diet is all over the place Worse with traveling  Wants to control with diet   Needs STD screen No symptoms   Social drinker-no problems with it  Is a bar owner Non smoker    BP Readings from Last 3 Encounters:  01/08/20 116/68  04/17/18 128/82  03/27/18 (!) 135/94   Pulse Readings from Last 3 Encounters:  01/08/20 70  04/17/18 72   03/27/18 66    Patient Active Problem List   Diagnosis Date Noted  . Stress reaction 04/17/2018  . Hyperlipidemia 02/06/2018  . Medication adverse effect 04/02/2015  . ADD (attention deficit disorder) 06/18/2013  . Screen for STD (sexually transmitted disease) 06/18/2013  . Routine general medical examination at a health care facility 09/02/2012   Past Medical History:  Diagnosis Date  . History of petit-mal seizures    as a child no seizures in over 20 years   No past surgical history on file. Social History   Tobacco Use  . Smoking status: Never Smoker  . Smokeless tobacco: Never Used  Substance Use Topics  . Alcohol use: Yes    Comment: occasional  . Drug use: No   Family History  Problem Relation Age of Onset  . Alcohol abuse Other        Uncle  . Arthritis Maternal Grandmother   . Arthritis Paternal Grandmother   . Heart disease Paternal Grandfather        MI, open heart surgery   . Heart attack Paternal Grandfather   . High blood pressure Father   . High blood pressure Other        all grandparents, and uncles and other relatives   No  Known Allergies Current Outpatient Medications on File Prior to Visit  Medication Sig Dispense Refill  . benzonatate (TESSALON) 200 MG capsule Take 1 capsule (200 mg total) by mouth 3 (three) times daily as needed. Swallow whole, do not bite pill 30 capsule 1  . naproxen (NAPROSYN) 500 MG tablet Take 1 tablet (500 mg total) by mouth 2 (two) times daily with a meal. 14 tablet 0   No current facility-administered medications on file prior to visit.     Review of Systems  Constitutional: Negative for activity change, appetite change, fatigue, fever and unexpected weight change.  HENT: Negative for congestion, rhinorrhea, sore throat and trouble swallowing.   Eyes: Negative for pain, redness, itching and visual disturbance.  Respiratory: Negative for cough, chest tightness, shortness of breath and wheezing.   Cardiovascular:  Negative for chest pain and palpitations.  Gastrointestinal: Negative for abdominal pain, blood in stool, constipation, diarrhea and nausea.  Endocrine: Negative for cold intolerance, heat intolerance, polydipsia and polyuria.  Genitourinary: Negative for difficulty urinating, dysuria, frequency and urgency.  Musculoskeletal: Negative for arthralgias, joint swelling and myalgias.  Skin: Negative for pallor and rash.  Neurological: Negative for dizziness, tremors, weakness, numbness and headaches.  Hematological: Negative for adenopathy. Does not bruise/bleed easily.  Psychiatric/Behavioral: Negative for decreased concentration and dysphoric mood. The patient is nervous/anxious.        Objective:   Physical Exam Constitutional:      General: He is not in acute distress.    Appearance: Normal appearance. He is well-developed. He is obese. He is not ill-appearing or diaphoretic.  HENT:     Head: Normocephalic and atraumatic.     Right Ear: Tympanic membrane, ear canal and external ear normal.     Left Ear: Tympanic membrane, ear canal and external ear normal.     Nose: Nose normal. No congestion.     Mouth/Throat:     Mouth: Mucous membranes are moist.     Pharynx: Oropharynx is clear. No posterior oropharyngeal erythema.  Eyes:     General: No scleral icterus.       Right eye: No discharge.        Left eye: No discharge.     Conjunctiva/sclera: Conjunctivae normal.     Pupils: Pupils are equal, round, and reactive to light.  Neck:     Thyroid: No thyromegaly.     Vascular: No carotid bruit or JVD.  Cardiovascular:     Rate and Rhythm: Normal rate and regular rhythm.     Pulses: Normal pulses.     Heart sounds: Normal heart sounds. No gallop.   Pulmonary:     Effort: Pulmonary effort is normal. No respiratory distress.     Breath sounds: Normal breath sounds. No wheezing or rales.     Comments: Good air exch Chest:     Chest wall: No tenderness.  Abdominal:     General:  Bowel sounds are normal. There is no distension or abdominal bruit.     Palpations: Abdomen is soft. There is no mass.     Tenderness: There is no abdominal tenderness.     Hernia: No hernia is present.  Musculoskeletal:        General: No tenderness.     Cervical back: Normal range of motion and neck supple. No rigidity. No muscular tenderness.     Right lower leg: No edema.     Left lower leg: No edema.  Lymphadenopathy:     Cervical: No cervical adenopathy.  Skin:    General: Skin is warm and dry.     Coloration: Skin is not pale.     Findings: No erythema or rash.  Neurological:     Mental Status: He is alert.     Cranial Nerves: No cranial nerve deficit.     Motor: No abnormal muscle tone.     Coordination: Coordination normal.     Gait: Gait normal.     Deep Tendon Reflexes: Reflexes are normal and symmetric. Reflexes normal.  Psychiatric:        Mood and Affect: Mood normal.        Cognition and Memory: Cognition and memory normal.     Comments: Pleasant and talkative            Assessment & Plan:   Problem List Items Addressed This Visit      Other   Routine general medical examination at a health care facility - Primary (Chronic)    Reviewed health habits including diet and exercise and skin cancer prevention Reviewed appropriate screening tests for age  Also reviewed health mt list, fam hx and immunization status , as well as social and family history   See HPI Labs reviewed  Disc upcoming trip to [place]-he will visit the travel clinic  Disc plan for diet/exercise for wt loss Flu vaccine given today  covid immunized and plans a booster Seeing counselor for mental health Continues Truvada for HIV prophylaxis and STD screen done today        Relevant Orders   Comprehensive metabolic panel (Completed)   Lipid panel (Completed)   CBC with Differential/Platelet (Completed)   TSH (Completed)   ADD (attention deficit disorder)    Refilled adderall xr  -which pt takes for work when needed        Screen for STD (sexually transmitted disease)    Gc/chlam, HIV , RPR, hep C today  He takes Truvada for HIV proph Adv safe sexual practices      Relevant Orders   HIV Antibody (routine testing w rflx) (Completed)   RPR (Completed)   Hepatitis C antibody   C. trachomatis/N. gonorrhoeae RNA   Hyperlipidemia    Labs today  Diet has been sub optimal with wt gain Disc goals for lipids and reasons to control them Rev last labs with pt Rev low sat fat diet in detail Labs drawn today  Pt wants to watch diet going forward and then re check  May be open to statin only if no imp with diet      Relevant Orders   Lipid panel (Completed)   Stress reaction    Continues seeing a counselor which is helpful  Recommend good self care along with regular exercise       Obesity (BMI 30-39.9)    Discussed how this problem influences overall health and the risks it imposes  Reviewed plan for weight loss with lower calorie diet (via better food choices and also portion control or program like weight watchers) and exercise building up to or more than 30 minutes 5 days per week including some aerobic activity   He has a set up to start exercising and looks forward to that       Relevant Medications   amphetamine-dextroamphetamine (ADDERALL XR) 20 MG 24 hr capsule    Other Visit Diagnoses    Need for influenza vaccination       Relevant Orders   Flu Vaccine QUAD 6+ mos PF IM (Fluarix Quad PF) (  Completed)

## 2020-01-09 DIAGNOSIS — E669 Obesity, unspecified: Secondary | ICD-10-CM | POA: Insufficient documentation

## 2020-01-09 LAB — COMPREHENSIVE METABOLIC PANEL
ALT: 17 U/L (ref 0–53)
AST: 15 U/L (ref 0–37)
Albumin: 4.3 g/dL (ref 3.5–5.2)
Alkaline Phosphatase: 73 U/L (ref 39–117)
BUN: 14 mg/dL (ref 6–23)
CO2: 31 mEq/L (ref 19–32)
Calcium: 9.7 mg/dL (ref 8.4–10.5)
Chloride: 101 mEq/L (ref 96–112)
Creatinine, Ser: 1.17 mg/dL (ref 0.40–1.50)
GFR: 82 mL/min (ref >60.00)
Glucose, Bld: 103 mg/dL — ABNORMAL HIGH (ref 70–99)
Potassium: 4.2 mEq/L (ref 3.5–5.1)
Sodium: 139 mEq/L (ref 135–145)
Total Bilirubin: 0.5 mg/dL (ref 0.2–1.2)
Total Protein: 7.3 g/dL (ref 6.0–8.3)

## 2020-01-09 LAB — FLUORESCENT TREPONEMAL AB(FTA)-IGG-BLD: Fluorescent Treponemal ABS: REACTIVE — AB

## 2020-01-09 NOTE — Assessment & Plan Note (Signed)
Labs today  Diet has been sub optimal with wt gain Disc goals for lipids and reasons to control them Rev last labs with pt Rev low sat fat diet in detail Labs drawn today  Pt wants to watch diet going forward and then re check  May be open to statin only if no imp with diet

## 2020-01-09 NOTE — Assessment & Plan Note (Signed)
Discussed how this problem influences overall health and the risks it imposes  Reviewed plan for weight loss with lower calorie diet (via better food choices and also portion control or program like weight watchers) and exercise building up to or more than 30 minutes 5 days per week including some aerobic activity   He has a set up to start exercising and looks forward to that

## 2020-01-09 NOTE — Assessment & Plan Note (Signed)
Reviewed health habits including diet and exercise and skin cancer prevention Reviewed appropriate screening tests for age  Also reviewed health mt list, fam hx and immunization status , as well as social and family history   See HPI Labs reviewed  Disc upcoming trip to [place]-he will visit the travel clinic  Disc plan for diet/exercise for wt loss Flu vaccine given today  covid immunized and plans a booster Seeing counselor for mental health Continues Truvada for HIV prophylaxis and STD screen done today

## 2020-01-09 NOTE — Assessment & Plan Note (Signed)
Continues seeing a counselor which is helpful  Recommend good self care along with regular exercise

## 2020-01-09 NOTE — Assessment & Plan Note (Signed)
Refilled adderall xr -which pt takes for work when needed

## 2020-01-09 NOTE — Assessment & Plan Note (Signed)
Gc/chlam, HIV , RPR, hep C today  He takes Truvada for HIV proph Adv safe sexual practices

## 2020-01-12 ENCOUNTER — Telehealth: Payer: Self-pay

## 2020-01-12 ENCOUNTER — Telehealth: Payer: Self-pay | Admitting: *Deleted

## 2020-01-12 LAB — HEPATITIS C ANTIBODY
Hepatitis C Ab: NONREACTIVE
SIGNAL TO CUT-OFF: 0.06 (ref <1.00)

## 2020-01-12 LAB — RPR TITER: RPR Titer: 1:64 {titer} — ABNORMAL HIGH

## 2020-01-12 LAB — RPR: RPR Ser Ql: REACTIVE — AB

## 2020-01-12 LAB — C. TRACHOMATIS/N. GONORRHOEAE RNA
C. trachomatis RNA, TMA: NOT DETECTED
N. gonorrhoeae RNA, TMA: NOT DETECTED

## 2020-01-12 LAB — HIV ANTIBODY (ROUTINE TESTING W REFLEX): HIV 1&2 Ab, 4th Generation: NONREACTIVE

## 2020-01-12 NOTE — Telephone Encounter (Signed)
PA done at [email or web address] for Truvada, I will await a response

## 2020-01-12 NOTE — Telephone Encounter (Signed)
Aware, thank you.

## 2020-01-12 NOTE — Telephone Encounter (Signed)
This is for follow up -thanks (not new)

## 2020-01-12 NOTE — Telephone Encounter (Signed)
[facility]ie notified of Dr. Royden Purl comments. They did want me to let Dr. Milinda Antis know they are concerned about him still having a positive result and elevated titers for years, they said they don't require a call back from our office but they just wanted to let PCP know they are going to reach out to him directly to let pt know their concerns and discuss if he needs f/u treatment with them

## 2020-01-12 NOTE — Telephone Encounter (Signed)
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 Corie request cb.     Best Call back number [phone]

## 2020-01-12 NOTE — Telephone Encounter (Signed)
Andrew Ball with [facility] left v/m that pt had reactive syphilis test on 01/08/20 and pt had previous + syphilis test in 2017; Andrew wants to know if this is FU or treatment or new symptoms of + syphilis. Andrew request cb.

## 2020-01-13 NOTE — Telephone Encounter (Signed)
PA was approved. Pharmacy notified, placed in PCP's inbox to sign and send for scanning

## 2020-01-14 ENCOUNTER — Ambulatory Visit: Payer: 59 | Admitting: Psychology

## 2020-01-21 ENCOUNTER — Ambulatory Visit (INDEPENDENT_AMBULATORY_CARE_PROVIDER_SITE_OTHER): Payer: 59 | Admitting: Psychology

## 2020-01-21 DIAGNOSIS — F411 Generalized anxiety disorder: Secondary | ICD-10-CM | POA: Diagnosis not present

## 2020-01-28 ENCOUNTER — Ambulatory Visit (INDEPENDENT_AMBULATORY_CARE_PROVIDER_SITE_OTHER): Payer: 59 | Admitting: Psychology

## 2020-01-28 DIAGNOSIS — F411 Generalized anxiety disorder: Secondary | ICD-10-CM | POA: Diagnosis not present

## 2020-02-04 ENCOUNTER — Ambulatory Visit: Payer: 59 | Admitting: Psychology

## 2020-02-11 ENCOUNTER — Ambulatory Visit: Payer: 59 | Admitting: Psychology

## 2020-02-18 ENCOUNTER — Ambulatory Visit (INDEPENDENT_AMBULATORY_CARE_PROVIDER_SITE_OTHER): Payer: 59 | Admitting: Psychology

## 2020-02-18 DIAGNOSIS — F411 Generalized anxiety disorder: Secondary | ICD-10-CM

## 2020-02-25 ENCOUNTER — Ambulatory Visit: Payer: 59 | Admitting: Psychology

## 2020-03-03 ENCOUNTER — Ambulatory Visit: Payer: 59 | Admitting: Psychology

## 2020-03-10 ENCOUNTER — Ambulatory Visit: Payer: 59 | Admitting: Psychology

## 2020-03-11 ENCOUNTER — Encounter: Payer: Self-pay | Admitting: Family Medicine

## 2020-03-14 ENCOUNTER — Telehealth: Payer: Self-pay | Admitting: Family Medicine

## 2020-03-14 NOTE — Telephone Encounter (Signed)
Received mychart message:  Andrew Ball, Andrew Ball  You 3 days ago   TS   I just tested positive for covid however I'm fine.Peggye Form had my booster and have a mild dry cough. can you give me a call so we can talk about next steps please.  [phone]  Please call and check in with him. Can certainly do a virtual visit if he would like

## 2020-03-15 NOTE — Telephone Encounter (Signed)
I spoke to pt and he reports he does not have any new Sx or concerns, still has dry cough... feeling pretty good

## 2020-03-15 NOTE — Telephone Encounter (Signed)
Left VM requesting pt to call the office back 

## 2020-03-17 ENCOUNTER — Ambulatory Visit: Payer: 59 | Admitting: Psychology

## 2020-03-24 ENCOUNTER — Ambulatory Visit: Payer: 59 | Admitting: Psychology

## 2020-03-31 ENCOUNTER — Ambulatory Visit: Payer: 59 | Admitting: Psychology

## 2020-04-07 ENCOUNTER — Ambulatory Visit: Payer: 59 | Admitting: Psychology

## 2020-04-14 ENCOUNTER — Ambulatory Visit: Payer: 59 | Admitting: Psychology

## 2020-04-14 ENCOUNTER — Ambulatory Visit (INDEPENDENT_AMBULATORY_CARE_PROVIDER_SITE_OTHER): Payer: 59 | Admitting: Psychology

## 2020-04-14 DIAGNOSIS — F411 Generalized anxiety disorder: Secondary | ICD-10-CM

## 2020-04-21 ENCOUNTER — Ambulatory Visit: Payer: 59 | Admitting: Psychology

## 2020-04-28 ENCOUNTER — Ambulatory Visit (INDEPENDENT_AMBULATORY_CARE_PROVIDER_SITE_OTHER): Payer: 59 | Admitting: Psychology

## 2020-04-28 ENCOUNTER — Ambulatory Visit: Payer: 59 | Admitting: Psychology

## 2020-04-28 DIAGNOSIS — F411 Generalized anxiety disorder: Secondary | ICD-10-CM

## 2020-05-05 ENCOUNTER — Ambulatory Visit (INDEPENDENT_AMBULATORY_CARE_PROVIDER_SITE_OTHER): Payer: 59 | Admitting: Psychology

## 2020-05-05 ENCOUNTER — Ambulatory Visit: Payer: 59 | Admitting: Psychology

## 2020-05-05 DIAGNOSIS — F411 Generalized anxiety disorder: Secondary | ICD-10-CM

## 2020-05-12 ENCOUNTER — Ambulatory Visit: Payer: 59 | Admitting: Psychology

## 2020-05-12 ENCOUNTER — Ambulatory Visit (INDEPENDENT_AMBULATORY_CARE_PROVIDER_SITE_OTHER): Payer: 59 | Admitting: Psychology

## 2020-05-12 DIAGNOSIS — F411 Generalized anxiety disorder: Secondary | ICD-10-CM

## 2020-05-19 ENCOUNTER — Ambulatory Visit: Payer: 59 | Admitting: Psychology

## 2020-05-20 ENCOUNTER — Ambulatory Visit (INDEPENDENT_AMBULATORY_CARE_PROVIDER_SITE_OTHER): Payer: 59 | Admitting: Psychology

## 2020-05-20 DIAGNOSIS — F411 Generalized anxiety disorder: Secondary | ICD-10-CM

## 2020-05-26 ENCOUNTER — Ambulatory Visit: Payer: 59 | Admitting: Psychology

## 2020-05-26 ENCOUNTER — Ambulatory Visit (INDEPENDENT_AMBULATORY_CARE_PROVIDER_SITE_OTHER): Payer: 59 | Admitting: Psychology

## 2020-05-26 DIAGNOSIS — F411 Generalized anxiety disorder: Secondary | ICD-10-CM | POA: Diagnosis not present

## 2020-06-02 ENCOUNTER — Ambulatory Visit: Payer: 59 | Admitting: Psychology

## 2020-06-02 ENCOUNTER — Ambulatory Visit (INDEPENDENT_AMBULATORY_CARE_PROVIDER_SITE_OTHER): Payer: 59 | Admitting: Psychology

## 2020-06-02 DIAGNOSIS — F411 Generalized anxiety disorder: Secondary | ICD-10-CM

## 2020-06-09 ENCOUNTER — Ambulatory Visit: Payer: 59 | Admitting: Psychology

## 2020-06-09 ENCOUNTER — Ambulatory Visit (INDEPENDENT_AMBULATORY_CARE_PROVIDER_SITE_OTHER): Payer: 59 | Admitting: Psychology

## 2020-06-09 DIAGNOSIS — F411 Generalized anxiety disorder: Secondary | ICD-10-CM

## 2020-06-16 ENCOUNTER — Ambulatory Visit: Payer: 59 | Admitting: Psychology

## 2020-06-16 ENCOUNTER — Ambulatory Visit (INDEPENDENT_AMBULATORY_CARE_PROVIDER_SITE_OTHER): Payer: 59 | Admitting: Psychology

## 2020-06-16 DIAGNOSIS — F411 Generalized anxiety disorder: Secondary | ICD-10-CM

## 2020-06-23 ENCOUNTER — Ambulatory Visit: Payer: 59 | Admitting: Psychology

## 2020-06-23 ENCOUNTER — Ambulatory Visit (INDEPENDENT_AMBULATORY_CARE_PROVIDER_SITE_OTHER): Payer: 59 | Admitting: Psychology

## 2020-06-23 DIAGNOSIS — F411 Generalized anxiety disorder: Secondary | ICD-10-CM

## 2020-06-30 ENCOUNTER — Ambulatory Visit: Payer: 59 | Admitting: Psychology

## 2020-07-07 ENCOUNTER — Ambulatory Visit (INDEPENDENT_AMBULATORY_CARE_PROVIDER_SITE_OTHER): Payer: 59 | Admitting: Psychology

## 2020-07-07 ENCOUNTER — Ambulatory Visit: Payer: 59 | Admitting: Psychology

## 2020-07-07 DIAGNOSIS — F411 Generalized anxiety disorder: Secondary | ICD-10-CM | POA: Diagnosis not present

## 2020-07-08 ENCOUNTER — Ambulatory Visit: Payer: 59 | Admitting: Psychology

## 2020-07-14 ENCOUNTER — Ambulatory Visit: Payer: 59 | Admitting: Psychology

## 2020-07-16 ENCOUNTER — Ambulatory Visit (INDEPENDENT_AMBULATORY_CARE_PROVIDER_SITE_OTHER): Payer: 59 | Admitting: Psychology

## 2020-07-16 DIAGNOSIS — F411 Generalized anxiety disorder: Secondary | ICD-10-CM | POA: Diagnosis not present

## 2020-07-21 ENCOUNTER — Ambulatory Visit: Payer: 59 | Admitting: Psychology

## 2020-07-21 ENCOUNTER — Ambulatory Visit (INDEPENDENT_AMBULATORY_CARE_PROVIDER_SITE_OTHER): Payer: 59 | Admitting: Psychology

## 2020-07-21 DIAGNOSIS — F411 Generalized anxiety disorder: Secondary | ICD-10-CM

## 2020-07-28 ENCOUNTER — Ambulatory Visit (INDEPENDENT_AMBULATORY_CARE_PROVIDER_SITE_OTHER): Payer: 59 | Admitting: Psychology

## 2020-07-28 ENCOUNTER — Ambulatory Visit: Payer: 59 | Admitting: Psychology

## 2020-07-28 DIAGNOSIS — F411 Generalized anxiety disorder: Secondary | ICD-10-CM

## 2020-08-04 ENCOUNTER — Ambulatory Visit (INDEPENDENT_AMBULATORY_CARE_PROVIDER_SITE_OTHER): Payer: 59 | Admitting: Psychology

## 2020-08-04 ENCOUNTER — Ambulatory Visit: Payer: 59 | Admitting: Psychology

## 2020-08-04 DIAGNOSIS — F411 Generalized anxiety disorder: Secondary | ICD-10-CM | POA: Diagnosis not present

## 2020-08-11 ENCOUNTER — Ambulatory Visit: Payer: 59 | Admitting: Psychology

## 2020-08-18 ENCOUNTER — Ambulatory Visit: Payer: 59 | Admitting: Psychology

## 2020-08-25 ENCOUNTER — Ambulatory Visit (INDEPENDENT_AMBULATORY_CARE_PROVIDER_SITE_OTHER): Payer: 59 | Admitting: Psychology

## 2020-08-25 DIAGNOSIS — F411 Generalized anxiety disorder: Secondary | ICD-10-CM

## 2020-09-01 ENCOUNTER — Ambulatory Visit (INDEPENDENT_AMBULATORY_CARE_PROVIDER_SITE_OTHER): Payer: 59 | Admitting: Psychology

## 2020-09-01 DIAGNOSIS — F411 Generalized anxiety disorder: Secondary | ICD-10-CM | POA: Diagnosis not present

## 2020-09-08 ENCOUNTER — Ambulatory Visit: Payer: 59 | Admitting: Psychology

## 2020-09-15 ENCOUNTER — Ambulatory Visit (INDEPENDENT_AMBULATORY_CARE_PROVIDER_SITE_OTHER): Payer: 59 | Admitting: Psychology

## 2020-09-15 DIAGNOSIS — F411 Generalized anxiety disorder: Secondary | ICD-10-CM

## 2020-09-22 ENCOUNTER — Ambulatory Visit (INDEPENDENT_AMBULATORY_CARE_PROVIDER_SITE_OTHER): Payer: 59 | Admitting: Psychology

## 2020-09-22 DIAGNOSIS — F411 Generalized anxiety disorder: Secondary | ICD-10-CM | POA: Diagnosis not present

## 2020-10-06 ENCOUNTER — Ambulatory Visit (INDEPENDENT_AMBULATORY_CARE_PROVIDER_SITE_OTHER): Payer: 59 | Admitting: Psychology

## 2020-10-06 DIAGNOSIS — F411 Generalized anxiety disorder: Secondary | ICD-10-CM | POA: Diagnosis not present

## 2020-10-13 ENCOUNTER — Ambulatory Visit (INDEPENDENT_AMBULATORY_CARE_PROVIDER_SITE_OTHER): Payer: 59 | Admitting: Psychology

## 2020-10-13 DIAGNOSIS — F411 Generalized anxiety disorder: Secondary | ICD-10-CM

## 2020-10-20 ENCOUNTER — Ambulatory Visit (INDEPENDENT_AMBULATORY_CARE_PROVIDER_SITE_OTHER): Payer: 59 | Admitting: Psychology

## 2020-10-20 DIAGNOSIS — F411 Generalized anxiety disorder: Secondary | ICD-10-CM | POA: Diagnosis not present

## 2020-10-27 ENCOUNTER — Ambulatory Visit (INDEPENDENT_AMBULATORY_CARE_PROVIDER_SITE_OTHER): Payer: 59 | Admitting: Psychology

## 2020-10-27 DIAGNOSIS — F411 Generalized anxiety disorder: Secondary | ICD-10-CM

## 2020-11-03 ENCOUNTER — Ambulatory Visit (INDEPENDENT_AMBULATORY_CARE_PROVIDER_SITE_OTHER): Payer: 59 | Admitting: Psychology

## 2020-11-03 DIAGNOSIS — F411 Generalized anxiety disorder: Secondary | ICD-10-CM | POA: Diagnosis not present

## 2020-11-10 ENCOUNTER — Ambulatory Visit (INDEPENDENT_AMBULATORY_CARE_PROVIDER_SITE_OTHER): Payer: 59 | Admitting: Psychology

## 2020-11-10 DIAGNOSIS — F411 Generalized anxiety disorder: Secondary | ICD-10-CM

## 2021-01-10 ENCOUNTER — Encounter: Payer: Self-pay | Admitting: Family Medicine

## 2021-01-10 ENCOUNTER — Ambulatory Visit (INDEPENDENT_AMBULATORY_CARE_PROVIDER_SITE_OTHER): Payer: Self-pay | Admitting: Family Medicine

## 2021-01-10 ENCOUNTER — Other Ambulatory Visit: Payer: Self-pay

## 2021-01-10 VITALS — BP 128/84 | HR 78 | Temp 97.4°F | Ht 73.0 in | Wt 288.0 lb

## 2021-01-10 DIAGNOSIS — Z23 Encounter for immunization: Secondary | ICD-10-CM

## 2021-01-10 DIAGNOSIS — F988 Other specified behavioral and emotional disorders with onset usually occurring in childhood and adolescence: Secondary | ICD-10-CM

## 2021-01-10 DIAGNOSIS — Z0000 Encounter for general adult medical examination without abnormal findings: Secondary | ICD-10-CM

## 2021-01-10 DIAGNOSIS — E669 Obesity, unspecified: Secondary | ICD-10-CM

## 2021-01-10 DIAGNOSIS — R7309 Other abnormal glucose: Secondary | ICD-10-CM

## 2021-01-10 DIAGNOSIS — Z113 Encounter for screening for infections with a predominantly sexual mode of transmission: Secondary | ICD-10-CM

## 2021-01-10 DIAGNOSIS — E7800 Pure hypercholesterolemia, unspecified: Secondary | ICD-10-CM

## 2021-01-10 LAB — LIPID PANEL
Cholesterol: 236 mg/dL — ABNORMAL HIGH (ref 0–200)
HDL: 47.7 mg/dL (ref >39.00)
LDL Cholesterol: 156 mg/dL — ABNORMAL HIGH (ref 0–99)
NonHDL: 187.96
Total CHOL/HDL Ratio: 5
Triglycerides: 159 mg/dL — ABNORMAL HIGH (ref 0.0–149.0)
VLDL: 31.8 mg/dL (ref 0.0–40.0)

## 2021-01-10 LAB — CBC WITH DIFFERENTIAL/PLATELET
Basophils Absolute: 0 10*3/uL (ref 0.0–0.1)
Basophils Relative: 0.7 % (ref 0.0–3.0)
Eosinophils Absolute: 0.4 10*3/uL (ref 0.0–0.7)
Eosinophils Relative: 6.8 % — ABNORMAL HIGH (ref 0.0–5.0)
HCT: 42.6 % (ref 39.0–52.0)
Hemoglobin: 13.7 g/dL (ref 13.0–17.0)
Lymphocytes Relative: 37.8 % (ref 12.0–46.0)
Lymphs Abs: 2.4 10*3/uL (ref 0.7–4.0)
MCHC: 32.2 g/dL (ref 30.0–36.0)
MCV: 86.4 fl (ref 78.0–100.0)
Monocytes Absolute: 0.6 10*3/uL (ref 0.1–1.0)
Monocytes Relative: 9.1 % (ref 3.0–12.0)
Neutro Abs: 2.9 10*3/uL (ref 1.4–7.7)
Neutrophils Relative %: 45.6 % (ref 43.0–77.0)
Platelets: 283 10*3/uL (ref 150.0–400.0)
RBC: 4.93 Mil/uL (ref 4.22–5.81)
RDW: 13.5 % (ref 11.5–15.5)
WBC: 6.3 10*3/uL (ref 4.0–10.5)

## 2021-01-10 LAB — COMPREHENSIVE METABOLIC PANEL
ALT: 17 U/L (ref 0–53)
AST: 17 U/L (ref 0–37)
Albumin: 4.5 g/dL (ref 3.5–5.2)
Alkaline Phosphatase: 85 U/L (ref 39–117)
BUN: 14 mg/dL (ref 6–23)
CO2: 28 mEq/L (ref 19–32)
Calcium: 9.9 mg/dL (ref 8.4–10.5)
Chloride: 102 mEq/L (ref 96–112)
Creatinine, Ser: 1.25 mg/dL (ref 0.40–1.50)
GFR: 72.57 mL/min (ref >60.00)
Glucose, Bld: 96 mg/dL (ref 70–99)
Potassium: 4.4 mEq/L (ref 3.5–5.1)
Sodium: 139 mEq/L (ref 135–145)
Total Bilirubin: 0.4 mg/dL (ref 0.2–1.2)
Total Protein: 7.8 g/dL (ref 6.0–8.3)

## 2021-01-10 LAB — HEMOGLOBIN A1C: Hgb A1c MFr Bld: 6.1 % (ref 4.6–6.5)

## 2021-01-10 LAB — TSH: TSH: 0.93 u[IU]/mL (ref 0.35–5.50)

## 2021-01-10 MED ORDER — AMPHETAMINE-DEXTROAMPHET ER 20 MG PO CP24: 20.0000 mg | ORAL_CAPSULE | Freq: Every day | ORAL | 0 refills | Status: AC

## 2021-01-10 MED ORDER — EMTRICITABINE-TENOFOVIR DF 200-300 MG PO TABS: 1.0000 | ORAL_TABLET | Freq: Every day | ORAL | 3 refills | Status: AC

## 2021-01-10 NOTE — Assessment & Plan Note (Signed)
Pt takes Truvada, HIV and hep C checked  Uses condoms Declines gc/chlamydia screening  In past syphilis ab pos, the health dept is aware Enc safe sexual practices  Has also had monkeypox vaccine

## 2021-01-10 NOTE — Progress Notes (Signed)
Subjective:    Patient ID: Andrew Ball, male    DOB: 27-Apr-1980, 40 y.o.   MRN: [id number]  This visit occurred during the SARS-CoV-2 public health emergency.  Safety protocols were in place, including screening questions prior to the visit, additional usage of staff PPE, and extensive cleaning of exam room while observing appropriate contact time as indicated for disinfecting solutions.   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)    Covid immunized  Flu shot- today  Tdap 6/14  STD screening  Had monkeypox vaccine   Had syphilis in the past/chronically positive test  [facility] him last year   Prostate health  No prostate cancer in family  No nocturia at all  No urinary changes   No colon cancer in family   GM had breast cancer- doing well   Taking Truvada for HIV prophylaxis  No problems   Due for labs  BP Readings from Last 3 Encounters:  01/08/20 116/68  04/17/18 128/82  03/27/18 (!) 135/94   Pulse Readings from Last 3 Encounters:  01/10/21 78  01/08/20 70  04/17/18 72     Hyperlipidemia Lab Results  Component Value Date   CHOL 233 (H) 01/08/2020   HDL 47.40 01/08/2020   LDLCALC 161 (H) 01/08/2020   TRIG 125.0 01/08/2020   CHOLHDL 5 01/08/2020   Diet  Has cut back on fried foods  GF has high cholesterol  Not a lot of red meat (occ steak)   1-2 times per month    Glucose 103 last time  Does not eat a lot of sweets  Alcohol however - has to watch carbs from that (social only)  Tries not to drink beer    Takes adderall  xr for ADD 20 mg daily Still takes it when needed/not daily   Patient Active Problem List   Diagnosis Date Noted   Elevated random blood glucose level 01/10/2021   Obesity (BMI 30-39.9) 01/09/2020   Stress reaction 04/17/2018   Hyperlipidemia 02/06/2018   Medication adverse effect 04/02/2015   ADD (attention deficit disorder) 06/18/2013   Screen for STD (sexually transmitted disease) 06/18/2013   Routine general medical examination at a health care facility 09/02/2012   Past Medical History:  Diagnosis Date   History of petit-mal seizures    as a child no seizures in over 20 years   History reviewed. No pertinent surgical history. Social History   Tobacco Use   Smoking status: Never   Smokeless tobacco: Never  Substance Use Topics   Alcohol use: Yes    Comment: occasional   Drug use: No   Family History  Problem Relation Age of Onset   High blood pressure Father    Arthritis Maternal Grandmother    Breast cancer Maternal Grandmother    Arthritis Paternal Grandmother    Heart disease Paternal Grandfather        MI, open heart surgery    Heart attack Paternal Grandfather    Alcohol abuse Other  Uncle   High blood pressure Other        all grandparents, and uncles and other relatives   No Known Allergies Current Outpatient Medications on File Prior to Visit  Medication Sig Dispense Refill   benzonatate (TESSALON) 200 MG capsule Take 1 capsule (200 mg total) by mouth 3 (three) times daily as needed. Swallow whole, do not bite pill 30 capsule 1   naproxen (NAPROSYN) 500 MG tablet Take 1 tablet (500 mg total) by mouth 2 (two) times daily with a meal. 14 tablet 0   No current facility-administered medications on file prior to visit.    Review of Systems  Constitutional:  Negative for activity change, appetite change, fatigue, fever and unexpected weight change.  HENT:  Negative for congestion, rhinorrhea, sore throat and trouble swallowing.   Eyes:  Negative for  pain, redness, itching and visual disturbance.  Respiratory:  Negative for cough, chest tightness, shortness of breath and wheezing.   Cardiovascular:  Negative for chest pain and palpitations.  Gastrointestinal:  Negative for abdominal pain, blood in stool, constipation, diarrhea and nausea.  Endocrine: Negative for cold intolerance, heat intolerance, polydipsia and polyuria.  Genitourinary:  Negative for difficulty urinating, dysuria, frequency and urgency.  Musculoskeletal:  Negative for arthralgias, joint swelling and myalgias.  Skin:  Negative for pallor and rash.  Neurological:  Negative for dizziness, tremors, weakness, numbness and headaches.  Hematological:  Negative for adenopathy. Does not bruise/bleed easily.  Psychiatric/Behavioral:  Negative for decreased concentration and dysphoric mood. The patient is not nervous/anxious.       Objective:   Physical Exam Constitutional:      General: He is not in acute distress.    Appearance: Normal appearance. He is well-developed. He is obese. He is not ill-appearing or diaphoretic.  HENT:     Head: Normocephalic and atraumatic.     Right Ear: Tympanic membrane, ear canal and external ear normal.     Left Ear: Tympanic membrane, ear canal and external ear normal.     Nose: Nose normal. No congestion.     Mouth/Throat:     Mouth: Mucous membranes are moist.     Pharynx: Oropharynx is clear. No posterior oropharyngeal erythema.  Eyes:     General: No scleral icterus.       Right eye: No discharge.        Left eye: No discharge.     Conjunctiva/sclera: Conjunctivae normal.     Pupils: Pupils are equal, round, and reactive to light.  Neck:     Thyroid: No thyromegaly.     Vascular: No carotid bruit or JVD.  Cardiovascular:     Rate and Rhythm: Normal rate and regular rhythm.     Pulses: Normal pulses.     Heart sounds: Normal heart sounds.    No gallop.  Pulmonary:     Effort: Pulmonary effort is normal. No respiratory  distress.     Breath sounds: Normal breath sounds. No wheezing or rales.     Comments: Good air exch Chest:     Chest wall: No tenderness.  Abdominal:     General: Bowel sounds are normal. There is no distension or abdominal bruit.     Palpations: Abdomen is soft. There is no mass.     Tenderness: There is no abdominal tenderness.     Hernia: No hernia is present.  Musculoskeletal:        General: No tenderness.     Cervical back: Normal range of motion  and neck supple. No rigidity. No muscular tenderness.     Right lower leg: No edema.     Left lower leg: No edema.  Lymphadenopathy:     Cervical: No cervical adenopathy.  Skin:    General: Skin is warm and dry.     Coloration: Skin is not pale.     Findings: No erythema or rash.  Neurological:     Mental Status: He is alert.     Cranial Nerves: No cranial nerve deficit.     Motor: No abnormal muscle tone.     Coordination: Coordination normal.     Gait: Gait normal.     Deep Tendon Reflexes: Reflexes are normal and symmetric. Reflexes normal.  Psychiatric:        Mood and Affect: Mood normal.        Cognition and Memory: Cognition and memory normal.     Comments: Pleasant           Assessment & Plan:   Problem List Items Addressed This Visit       Other   Routine general medical examination at a health care facility - Primary (Chronic)    Reviewed health habits including diet and exercise and skin cancer prevention Reviewed appropriate screening tests for age  Also reviewed health mt list, fam hx and immunization status , as well as social and family history   See HPI Labs ordered  Enc continued exercise and better diet  Also counseling for mental health covid immunized, also imm for monkeypox STD screen ordered Flu shot given  No prostate problems       Relevant Orders   CBC with Differential/Platelet   Comprehensive metabolic panel   Lipid panel   TSH   ADD (attention deficit disorder)    Continues  periodic adderall xr 20 mg  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      Screen for STD (sexually transmitted disease)    Pt takes Truvada, HIV and hep C checked  Uses condoms Declines gc/chlamydia screening  In past syphilis ab pos, the health dept is aware Enc safe sexual practices  Has also had monkeypox vaccine      Relevant Orders   HIV Antibody (routine testing w rflx)   Hepatitis C antibody   Hyperlipidemia    Disc goals for lipids and reasons to control them Rev last labs with pt Rev low sat fat diet in detail Lab pending  This runs in family  Diet is a bit better        Relevant Orders   Lipid panel   Obesity (BMI 30-39.9)    Discussed how this problem influences overall health and the risks it imposes  Reviewed plan for weight loss with lower calorie diet (via better food choices and also portion control or program like weight watchers) and exercise building up to or more than 30 minutes 5 days per week including some aerobic activity   He is working on this-commended      Relevant Medications   amphetamine-dextroamphetamine (ADDERALL XR) 20 MG 24 hr capsule   Elevated random blood glucose level    A1C drawn disc imp of low glycemic diet and wt loss to prevent DM2       Relevant Orders   Hemoglobin A1c   Other Visit Diagnoses     Need for influenza vaccination       Relevant Orders   Flu Vaccine QUAD 6+ mos PF IM (Fluarix  Quad PF) (Completed)

## 2021-01-10 NOTE — Patient Instructions (Signed)
Take care of yourself   Keep working out  Eat healthy   Avoid red meat/ fried foods/ egg yolks/ fatty breakfast meats/ butter, cheese and high fat dairy/ and shellfish    Labs today  Flu shot today

## 2021-01-10 NOTE — Assessment & Plan Note (Signed)
Discussed how this problem influences overall health and the risks it imposes  Reviewed plan for weight loss with lower calorie diet (via better food choices and also portion control or program like weight watchers) and exercise building up to or more than 30 minutes 5 days per week including some aerobic activity   He is working on this-commended

## 2021-01-10 NOTE — Assessment & Plan Note (Signed)
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

## 2021-01-10 NOTE — Assessment & Plan Note (Signed)
A1C drawn disc imp of low glycemic diet and wt loss to prevent DM2  

## 2021-01-10 NOTE — Assessment & Plan Note (Signed)
Disc goals for lipids and reasons to control them Rev last labs with pt Rev low sat fat diet in detail Lab pending  This runs in family  Diet is a bit better

## 2021-01-10 NOTE — Assessment & Plan Note (Signed)
Reviewed health habits including diet and exercise and skin cancer prevention Reviewed appropriate screening tests for age  Also reviewed health mt list, fam hx and immunization status , as well as social and family history   See HPI Labs ordered  Enc continued exercise and better diet  Also counseling for mental health covid immunized, also imm for monkeypox STD screen ordered Flu shot given  No prostate problems

## 2021-01-11 ENCOUNTER — Encounter: Payer: Self-pay | Admitting: Family Medicine

## 2021-01-11 LAB — HIV ANTIBODY (ROUTINE TESTING W REFLEX): HIV 1&2 Ab, 4th Generation: NONREACTIVE

## 2021-01-11 LAB — HEPATITIS C ANTIBODY
Hepatitis C Ab: NONREACTIVE
SIGNAL TO CUT-OFF: 0.27 (ref <1.00)

## 2021-01-11 MED ORDER — ROSUVASTATIN CALCIUM 5 MG PO TABS: 5.0000 mg | ORAL_TABLET | Freq: Every day | ORAL | 3 refills | Status: AC

## 2021-12-27 ENCOUNTER — Encounter: Payer: Self-pay | Admitting: Family Medicine

## 2021-12-27 NOTE — Telephone Encounter (Signed)
Per PCP pt needs an appt for eval

## 2021-12-27 NOTE — Telephone Encounter (Signed)
Patient is currently in [place]. He stated that the cough is just leftover from him having Covid. He did schedule his CPE for when he is back in town.

## 2022-01-18 ENCOUNTER — Encounter: Payer: Self-pay | Admitting: Family Medicine

## 2022-01-19 ENCOUNTER — Encounter: Payer: Self-pay | Admitting: Family Medicine
